# Patient Record
Sex: Male | Born: 1960 | Race: White | Hispanic: No | Marital: Married | Smoking: Never smoker
Health system: Southern US, Community
[De-identification: ages and names within clinical notes are randomized; demographics above are authoritative.]

## PROBLEM LIST (undated history)

## (undated) DIAGNOSIS — E119 Type 2 diabetes mellitus without complications: Secondary | ICD-10-CM

## (undated) DIAGNOSIS — E785 Hyperlipidemia, unspecified: Secondary | ICD-10-CM

## (undated) HISTORY — DX: Type 2 diabetes mellitus without complications: E11.9

## (undated) HISTORY — DX: Hyperlipidemia, unspecified: E78.5

## (undated) HISTORY — PX: ROTATOR CUFF REPAIR: SHX139

## (undated) HISTORY — PX: KNEE SURGERY: SHX244

---

## 1999-11-07 ENCOUNTER — Inpatient Hospital Stay (HOSPITAL_COMMUNITY): Admission: EM | Admit: 1999-11-07 | Discharge: 1999-11-08 | Payer: Self-pay | Admitting: Cardiology

## 2004-09-17 ENCOUNTER — Ambulatory Visit: Payer: Self-pay | Admitting: Cardiology

## 2004-10-15 ENCOUNTER — Ambulatory Visit: Payer: Self-pay | Admitting: Cardiology

## 2015-08-15 ENCOUNTER — Other Ambulatory Visit: Payer: Self-pay | Admitting: "Endocrinology

## 2015-08-22 ENCOUNTER — Other Ambulatory Visit: Payer: Self-pay | Admitting: "Endocrinology

## 2015-09-07 LAB — HEMOGLOBIN A1C: Hgb A1c MFr Bld: 9.1 % — AB (ref 4.0–6.0)

## 2015-09-14 ENCOUNTER — Other Ambulatory Visit: Payer: Self-pay | Admitting: "Endocrinology

## 2015-09-14 ENCOUNTER — Encounter: Payer: Self-pay | Admitting: "Endocrinology

## 2015-09-14 ENCOUNTER — Ambulatory Visit (INDEPENDENT_AMBULATORY_CARE_PROVIDER_SITE_OTHER): Payer: BLUE CROSS/BLUE SHIELD | Admitting: "Endocrinology

## 2015-09-14 VITALS — BP 136/83 | HR 100 | Ht 72.0 in | Wt 228.0 lb

## 2015-09-14 DIAGNOSIS — E118 Type 2 diabetes mellitus with unspecified complications: Secondary | ICD-10-CM

## 2015-09-14 DIAGNOSIS — E782 Mixed hyperlipidemia: Secondary | ICD-10-CM | POA: Insufficient documentation

## 2015-09-14 DIAGNOSIS — IMO0002 Reserved for concepts with insufficient information to code with codable children: Secondary | ICD-10-CM

## 2015-09-14 DIAGNOSIS — I10 Essential (primary) hypertension: Secondary | ICD-10-CM | POA: Diagnosis not present

## 2015-09-14 DIAGNOSIS — E1165 Type 2 diabetes mellitus with hyperglycemia: Secondary | ICD-10-CM | POA: Diagnosis not present

## 2015-09-14 DIAGNOSIS — E1159 Type 2 diabetes mellitus with other circulatory complications: Secondary | ICD-10-CM | POA: Insufficient documentation

## 2015-09-14 DIAGNOSIS — Z794 Long term (current) use of insulin: Secondary | ICD-10-CM

## 2015-09-14 DIAGNOSIS — E785 Hyperlipidemia, unspecified: Secondary | ICD-10-CM | POA: Diagnosis not present

## 2015-09-14 MED ORDER — INSULIN DEGLUDEC 100 UNIT/ML ~~LOC~~ SOPN: 50.0000 [IU] | PEN_INJECTOR | Freq: Every day | SUBCUTANEOUS | Status: DC

## 2015-09-14 NOTE — Progress Notes (Signed)
Subjective:    Patient ID: George White, male    DOB: 05-18-1961,    Past Medical History  Diagnosis Date  . Diabetes mellitus, type II (HCC)   . Hyperlipidemia    Past Surgical History  Procedure Laterality Date  . Rotator cuff repair     Social History   Social History  . Marital Status: Married    Spouse Name: N/A  . Number of Children: N/A  . Years of Education: N/A   Social History Main Topics  . Smoking status: Never Smoker   . Smokeless tobacco: None  . Alcohol Use: No  . Drug Use: No  . Sexual Activity: Not Asked   Other Topics Concern  . None   Social History Narrative  . None   Outpatient Encounter Prescriptions as of 09/14/2015  Medication Sig  . INVOKANA 300 MG TABS tablet TAKE 1 TABLET BY MOUTH EVERY MORNING  . metFORMIN (GLUCOPHAGE) 1000 MG tablet Take 1,000 mg by mouth 2 (two) times daily with a meal.  . sildenafil (VIAGRA) 50 MG tablet Take 50 mg by mouth daily as needed for erectile dysfunction.  . simvastatin (ZOCOR) 10 MG tablet TAKE 1 TABLET BY MOUTH AT BEDTIME  . [DISCONTINUED] Albiglutide (TANZEUM) 50 MG PEN Inject into the skin once a week.  . Insulin Degludec (TRESIBA FLEXTOUCH) 100 UNIT/ML SOPN Inject 50 Units into the skin at bedtime.  . [DISCONTINUED] LEVEMIR FLEXTOUCH 100 UNIT/ML Pen INJECT 60 UNITS SUB-Q AT BEDTIME   No facility-administered encounter medications on file as of 09/14/2015.   ALLERGIES: No Known Allergies VACCINATION STATUS:  There is no immunization history on file for this patient.  Diabetes He presents for his follow-up diabetic visit. He has type 2 diabetes mellitus. Onset time: He was diagnosed at approximate age of 29 years. His disease course has been worsening. There are no hypoglycemic associated symptoms. Pertinent negatives for hypoglycemia include no confusion, headaches, pallor or seizures. Associated symptoms include polydipsia and polyuria. Pertinent negatives for diabetes include no chest pain,  no fatigue, no polyphagia and no weakness. There are no hypoglycemic complications. Symptoms are worsening. Diabetic complications include impotence. Risk factors for coronary artery disease include diabetes mellitus, dyslipidemia, hypertension, male sex and sedentary lifestyle. Current diabetic treatment includes insulin injections and oral agent (dual therapy). He is compliant with treatment most of the time. His weight is stable. He is following a generally unhealthy diet. He participates in exercise intermittently. His home blood glucose trend is increasing steadily. His overall blood glucose range is >200 mg/dl. An ACE inhibitor/angiotensin II receptor blocker is not being taken.  Hyperlipidemia This is a chronic problem. The current episode started more than 1 year ago. The problem is uncontrolled. Exacerbating diseases include diabetes and obesity. Pertinent negatives include no chest pain, myalgias or shortness of breath. Current antihyperlipidemic treatment includes statins.  Hypertension Pertinent negatives include no chest pain, headaches, neck pain, palpitations or shortness of breath.     Review of Systems  Constitutional: Negative for fatigue and unexpected weight change.  HENT: Negative for dental problem, mouth sores and trouble swallowing.   Eyes: Negative for visual disturbance.  Respiratory: Negative for cough, choking, chest tightness, shortness of breath and wheezing.   Cardiovascular: Negative for chest pain, palpitations and leg swelling.  Gastrointestinal: Negative for nausea, vomiting, abdominal pain, diarrhea, constipation and abdominal distention.  Endocrine: Positive for polydipsia and polyuria. Negative for polyphagia.  Genitourinary: Positive for impotence. Negative for dysuria, urgency, hematuria and flank pain.  Musculoskeletal: Negative for myalgias, back pain, gait problem and neck pain.  Skin: Negative for pallor, rash and wound.  Neurological: Negative for  seizures, syncope, weakness, numbness and headaches.  Psychiatric/Behavioral: Negative.  Negative for confusion and dysphoric mood.    Objective:    BP 136/83 mmHg  Pulse 100  Ht 6' (1.829 m)  Wt 228 lb (103.42 kg)  BMI 30.92 kg/m2  SpO2 96%  Wt Readings from Last 3 Encounters:  09/14/15 228 lb (103.42 kg)    Physical Exam  Constitutional: He is oriented to person, place, and time. He appears well-developed and well-nourished. He is cooperative. No distress.  HENT:  Head: Normocephalic and atraumatic.  Eyes: EOM are normal.  Neck: Normal range of motion. Neck supple. No tracheal deviation present. No thyromegaly present.  Cardiovascular: Normal rate, S1 normal, S2 normal and normal heart sounds.  Exam reveals no gallop.   No murmur heard. Pulses:      Dorsalis pedis pulses are 1+ on the right side, and 1+ on the left side.       Posterior tibial pulses are 1+ on the right side, and 1+ on the left side.  Pulmonary/Chest: Breath sounds normal. No respiratory distress. He has no wheezes.  Abdominal: Soft. Bowel sounds are normal. He exhibits no distension. There is no tenderness. There is no guarding and no CVA tenderness.  Musculoskeletal: He exhibits no edema.       Right shoulder: He exhibits no swelling and no deformity.  Neurological: He is alert and oriented to person, place, and time. He has normal strength and normal reflexes. No cranial nerve deficit or sensory deficit. Gait normal.  Skin: Skin is warm and dry. No rash noted. No cyanosis. Nails show no clubbing.  Psychiatric: He has a normal mood and affect. His speech is normal and behavior is normal. Judgment and thought content normal. Cognition and memory are normal.   He is A1c remains high at 9%.   Assessment & Plan:   1. Uncontrolled type 2 diabetes mellitus with complication, with long-term current use of insulin (HCC)  - patient remains at a high risk for more acute and chronic complications of diabetes which  include CAD, CVA, CKD, retinopathy, and neuropathy. These are all discussed in detail with the patient.  Patient came with above target glucose profile, and  recent A1c of 9.1% unchanged from last visit when it was 9 %.  Glucose logs and insulin administration records pertaining to this visit,  to be scanned into patient's records.  Recent labs reviewed.   - I have re-counseled the patient on diet management and weight loss  by adopting a carbohydrate restricted / protein rich  Diet.  - Suggestion is made for patient to avoid simple carbohydrates   from their diet including Cakes , Desserts, Ice Cream,  Soda (  diet and regular) , Sweet Tea , Candies,  Chips, Cookies, Artificial Sweeteners,   and "Sugar-free" Products .  This will help patient to have stable blood glucose profile and potentially avoid unintended  Weight gain.  - Patient is advised to stick to a routine mealtimes to eat 3 meals  a day and avoid unnecessary snacks ( to snack only to correct hypoglycemia).  - The patient  will be  scheduled with Norm Salt, RDN, CDE for individualized DM education.  - I have approached patient with the following individualized plan to manage diabetes and patient agrees.  - His insurance would not pay for Levemir, I will  switch to Tresiba 100  50 units  qhs.  -He will monitor BG AC and HS and present his log and meter in 2 weeks  for review. -He may need prandial insulin based on his blood glucose readings. -Patient is encouraged to call clinic for blood glucose levels less than 70 or above 300 mg /dl. -I will continue MTF  po BID, Invokana to  po qday.  -His insurance stopped paying for Tanzeum, hence I  will discontinue.   - Patient specific target  for A1c; LDL, HDL, Triglycerides, and  Waist Circumference were discussed in detail.  2) BP/HTN: Uncontrolled.  I will consider low-dose ace inhibitors next visit.  3) Lipids/HPL:  continue statins. 4)  Weight/Diet: CDE consult in  progress, exercise, and carbohydrates information provided.  5) Chronic Care/Health Maintenance:  -Patient is  on  medications and encouraged to continue to follow up with Ophthalmology, Podiatrist at least yearly or according to recommendations, and advised to  stay away from smoking. I have recommended yearly flu vaccine and pneumonia vaccination at least every 5 years; moderate intensity exercise for up to 150 minutes weekly; and  sleep for at least 7 hours a day.  - 25 minutes of time was spent on the care of this patient , 50% of which was applied for counseling on diabetes complications and their preventions.   - I advised patient to maintain close follow up with their PCP for primary care needs.  - Patient is asked to bring meter and  blood glucose logs during their next visit.   Follow up plan: Return in about 2 weeks (around 09/28/2015) for diabetes, high blood pressure, high cholesterol, follow up with meter and logs- no labs.  George Lunch, MD Phone: [phone]  Fax: [phone]   09/14/2015, 6:49 PM

## 2015-09-14 NOTE — Patient Instructions (Signed)

## 2015-09-19 ENCOUNTER — Other Ambulatory Visit: Payer: Self-pay | Admitting: "Endocrinology

## 2015-09-29 ENCOUNTER — Ambulatory Visit (INDEPENDENT_AMBULATORY_CARE_PROVIDER_SITE_OTHER): Payer: BLUE CROSS/BLUE SHIELD | Admitting: "Endocrinology

## 2015-09-29 ENCOUNTER — Encounter: Payer: Self-pay | Admitting: "Endocrinology

## 2015-09-29 VITALS — BP 117/74 | HR 93 | Ht 72.0 in | Wt 227.0 lb

## 2015-09-29 DIAGNOSIS — I10 Essential (primary) hypertension: Secondary | ICD-10-CM | POA: Diagnosis not present

## 2015-09-29 DIAGNOSIS — Z794 Long term (current) use of insulin: Secondary | ICD-10-CM

## 2015-09-29 DIAGNOSIS — E118 Type 2 diabetes mellitus with unspecified complications: Secondary | ICD-10-CM

## 2015-09-29 DIAGNOSIS — IMO0002 Reserved for concepts with insufficient information to code with codable children: Secondary | ICD-10-CM

## 2015-09-29 DIAGNOSIS — E785 Hyperlipidemia, unspecified: Secondary | ICD-10-CM

## 2015-09-29 DIAGNOSIS — E1165 Type 2 diabetes mellitus with hyperglycemia: Secondary | ICD-10-CM | POA: Diagnosis not present

## 2015-09-29 MED ORDER — INSULIN DEGLUDEC 100 UNIT/ML ~~LOC~~ SOPN: 56.0000 [IU] | PEN_INJECTOR | Freq: Every day | SUBCUTANEOUS | Status: DC

## 2015-09-29 NOTE — Patient Instructions (Signed)

## 2015-09-30 NOTE — Progress Notes (Signed)
Subjective:    Patient ID: George White, male    DOB: 1961/01/30,    Past Medical History  Diagnosis Date  . Diabetes mellitus, type II (HCC)   . Hyperlipidemia    Past Surgical History  Procedure Laterality Date  . Rotator cuff repair     Social History   Social History  . Marital Status: Married    Spouse Name: N/A  . Number of Children: N/A  . Years of Education: N/A   Social History Main Topics  . Smoking status: Never Smoker   . Smokeless tobacco: None  . Alcohol Use: No  . Drug Use: No  . Sexual Activity: Not Asked   Other Topics Concern  . None   Social History Narrative   Outpatient Encounter Prescriptions as of 09/29/2015  Medication Sig  . BD PEN NEEDLE NANO U/F 32G X 4 MM MISC USE EVERY DAY AT BEDTIME  . Insulin Degludec (TRESIBA FLEXTOUCH) 100 UNIT/ML SOPN Inject 56 Units into the skin at bedtime.  . INVOKANA 300 MG TABS tablet TAKE 1 TABLET BY MOUTH EVERY MORNING  . metFORMIN (GLUCOPHAGE) 1000 MG tablet TAKE 1 TABLET BY MOUTH TWICE A DAY  . sildenafil (VIAGRA) 50 MG tablet Take 50 mg by mouth daily as needed for erectile dysfunction.  . simvastatin (ZOCOR) 10 MG tablet TAKE 1 TABLET BY MOUTH AT BEDTIME  . [DISCONTINUED] Insulin Degludec (TRESIBA FLEXTOUCH) 100 UNIT/ML SOPN Inject 50 Units into the skin at bedtime.   No facility-administered encounter medications on file as of 09/29/2015.   ALLERGIES: No Known Allergies VACCINATION STATUS:  There is no immunization history on file for this patient.  Diabetes He presents for his follow-up diabetic visit. He has type 2 diabetes mellitus. Onset time: He was diagnosed at approximate age of 29 years. His disease course has been worsening. There are no hypoglycemic associated symptoms. Pertinent negatives for hypoglycemia include no confusion, headaches, pallor or seizures. Associated symptoms include polydipsia and polyuria. Pertinent negatives for diabetes include no chest pain, no fatigue, no  polyphagia and no weakness. There are no hypoglycemic complications. Symptoms are improving. Diabetic complications include impotence. Risk factors for coronary artery disease include diabetes mellitus, dyslipidemia, hypertension, male sex and sedentary lifestyle. Current diabetic treatment includes insulin injections and oral agent (dual therapy). He is compliant with treatment most of the time. His weight is stable. He is following a generally healthy diet. He participates in exercise intermittently. His home blood glucose trend is increasing steadily. His breakfast blood glucose range is generally 140-180 mg/dl. His lunch blood glucose range is generally 140-180 mg/dl. His dinner blood glucose range is generally 140-180 mg/dl. His overall blood glucose range is 140-180 mg/dl. An ACE inhibitor/angiotensin II receptor blocker is not being taken.  Hyperlipidemia This is a chronic problem. The current episode started more than 1 year ago. The problem is uncontrolled. Exacerbating diseases include diabetes and obesity. Pertinent negatives include no chest pain, myalgias or shortness of breath. Current antihyperlipidemic treatment includes statins.  Hypertension Pertinent negatives include no chest pain, headaches, neck pain, palpitations or shortness of breath.     Review of Systems  Constitutional: Negative for fatigue and unexpected weight change.  HENT: Negative for dental problem, mouth sores and trouble swallowing.   Eyes: Negative for visual disturbance.  Respiratory: Negative for cough, choking, chest tightness, shortness of breath and wheezing.   Cardiovascular: Negative for chest pain, palpitations and leg swelling.  Gastrointestinal: Negative for nausea, vomiting, abdominal pain, diarrhea, constipation  and abdominal distention.  Endocrine: Positive for polydipsia and polyuria. Negative for polyphagia.  Genitourinary: Positive for impotence. Negative for dysuria, urgency, hematuria and flank  pain.  Musculoskeletal: Negative for myalgias, back pain, gait problem and neck pain.  Skin: Negative for pallor, rash and wound.  Neurological: Negative for seizures, syncope, weakness, numbness and headaches.  Psychiatric/Behavioral: Negative.  Negative for confusion and dysphoric mood.    Objective:    BP 117/74 mmHg  Pulse 93  Ht 6' (1.829 m)  Wt 227 lb (102.967 kg)  BMI 30.78 kg/m2  SpO2 97%  Wt Readings from Last 3 Encounters:  09/29/15 227 lb (102.967 kg)  09/14/15 228 lb (103.42 kg)    Physical Exam  Constitutional: He is oriented to person, place, and time. He appears well-developed and well-nourished. He is cooperative. No distress.  HENT:  Head: Normocephalic and atraumatic.  Eyes: EOM are normal.  Neck: Normal range of motion. Neck supple. No tracheal deviation present. No thyromegaly present.  Cardiovascular: Normal rate, S1 normal, S2 normal and normal heart sounds.  Exam reveals no gallop.   No murmur heard. Pulses:      Dorsalis pedis pulses are 1+ on the right side, and 1+ on the left side.       Posterior tibial pulses are 1+ on the right side, and 1+ on the left side.  Pulmonary/Chest: Breath sounds normal. No respiratory distress. He has no wheezes.  Abdominal: Soft. Bowel sounds are normal. He exhibits no distension. There is no tenderness. There is no guarding and no CVA tenderness.  Musculoskeletal: He exhibits no edema.       Right shoulder: He exhibits no swelling and no deformity.  Neurological: He is alert and oriented to person, place, and time. He has normal strength and normal reflexes. No cranial nerve deficit or sensory deficit. Gait normal.  Skin: Skin is warm and dry. No rash noted. No cyanosis. Nails show no clubbing.  Psychiatric: He has a normal mood and affect. His speech is normal and behavior is normal. Judgment and thought content normal. Cognition and memory are normal.   He is A1c remains high at 9%.   Assessment & Plan:   1.  Uncontrolled type 2 diabetes mellitus with complication, with long-term current use of insulin (HCC)  - patient remains at a high risk for more acute and chronic complications of diabetes which include CAD, CVA, CKD, retinopathy, and neuropathy. These are all discussed in detail with the patient.  Patient came with near  target glucose profile, but  recent A1c of 9.1% unchanged from last visit when it was 9 %.  Glucose logs and insulin administration records pertaining to this visit,  to be scanned into patient's records.  Recent labs reviewed.   - I have re-counseled the patient on diet management and weight loss  by adopting a carbohydrate restricted / protein rich  Diet.  - Suggestion is made for patient to avoid simple carbohydrates   from their diet including Cakes , Desserts, Ice Cream,  Soda (  diet and regular) , Sweet Tea , Candies,  Chips, Cookies, Artificial Sweeteners,   and "Sugar-free" Products .  This will help patient to have stable blood glucose profile and potentially avoid unintended  Weight gain.  - Patient is advised to stick to a routine mealtimes to eat 3 meals  a day and avoid unnecessary snacks ( to snack only to correct hypoglycemia).  - The patient  will be  scheduled with Norm Salt,  RDN, CDE for individualized DM education.  - I have approached patient with the following individualized plan to manage diabetes and patient agrees.  - I will increase his Evaristo Bury to 56 units qhs,  Associated with monitoing of BG QAM.  -He will not need prandial insulin based on his blood glucose readings. -Patient is encouraged to call clinic for blood glucose levels less than 70 or above 300 mg /dl. -I will continue MTF  po BID, Invokana to  po qday.  -His insurance stopped paying for Tanzeum, hence I  will discontinue.  - Patient specific target  for A1c; LDL, HDL, Triglycerides, and  Waist Circumference were discussed in detail.  2) BP/HTN: Uncontrolled.  I will  consider low-dose ace inhibitors next visit.  3) Lipids/HPL:  continue statins. 4)  Weight/Diet: CDE consult in progress, exercise, and carbohydrates information provided.  5) Chronic Care/Health Maintenance:  -Patient is  on  medications and encouraged to continue to follow up with Ophthalmology, Podiatrist at least yearly or according to recommendations, and advised to  stay away from smoking. I have recommended yearly flu vaccine and pneumonia vaccination at least every 5 years; moderate intensity exercise for up to 150 minutes weekly; and  sleep for at least 7 hours a day.  - 25 minutes of time was spent on the care of this patient , 50% of which was applied for counseling on diabetes complications and their preventions.   - I advised patient to maintain close follow up with their PCP for primary care needs.  - Patient is asked to bring meter and  blood glucose logs during their next visit.   Follow up plan: Return in about 11 weeks (around 12/15/2015) for diabetes, high blood pressure, high cholesterol, follow up with pre-visit labs, meter, and logs.  Marquis Lunch, MD Phone: [phone]  Fax: [phone]   09/30/2015, 4:16 AM

## 2015-10-18 ENCOUNTER — Other Ambulatory Visit: Payer: Self-pay

## 2015-10-18 MED ORDER — METFORMIN HCL 1000 MG PO TABS: 1000.0000 mg | ORAL_TABLET | Freq: Two times a day (BID) | ORAL | Status: DC

## 2015-12-08 ENCOUNTER — Other Ambulatory Visit: Payer: Self-pay | Admitting: "Endocrinology

## 2015-12-12 ENCOUNTER — Other Ambulatory Visit: Payer: Self-pay | Admitting: "Endocrinology

## 2015-12-12 LAB — BASIC METABOLIC PANEL
BUN: 11 mg/dL (ref 7–25)
CO2: 26 mmol/L (ref 20–31)
Calcium: 9.4 mg/dL (ref 8.6–10.3)
Chloride: 103 mmol/L (ref 98–110)
Creat: 0.78 mg/dL (ref 0.70–1.33)
Glucose, Bld: 169 mg/dL — ABNORMAL HIGH (ref 65–99)
Potassium: 4.4 mmol/L (ref 3.5–5.3)
Sodium: 138 mmol/L (ref 135–146)

## 2015-12-12 LAB — HEMOGLOBIN A1C
Hgb A1c MFr Bld: 8.7 % — ABNORMAL HIGH (ref <5.7)
Mean Plasma Glucose: 203 mg/dL — ABNORMAL HIGH (ref <117)

## 2015-12-15 ENCOUNTER — Ambulatory Visit (INDEPENDENT_AMBULATORY_CARE_PROVIDER_SITE_OTHER): Payer: BLUE CROSS/BLUE SHIELD | Admitting: "Endocrinology

## 2015-12-15 ENCOUNTER — Encounter: Payer: Self-pay | Admitting: "Endocrinology

## 2015-12-15 VITALS — BP 115/80 | HR 106 | Ht 72.0 in | Wt 230.0 lb

## 2015-12-15 DIAGNOSIS — I10 Essential (primary) hypertension: Secondary | ICD-10-CM | POA: Diagnosis not present

## 2015-12-15 DIAGNOSIS — Z794 Long term (current) use of insulin: Secondary | ICD-10-CM | POA: Diagnosis not present

## 2015-12-15 DIAGNOSIS — IMO0002 Reserved for concepts with insufficient information to code with codable children: Secondary | ICD-10-CM

## 2015-12-15 DIAGNOSIS — E118 Type 2 diabetes mellitus with unspecified complications: Secondary | ICD-10-CM | POA: Diagnosis not present

## 2015-12-15 DIAGNOSIS — E1165 Type 2 diabetes mellitus with hyperglycemia: Secondary | ICD-10-CM | POA: Diagnosis not present

## 2015-12-15 DIAGNOSIS — E785 Hyperlipidemia, unspecified: Secondary | ICD-10-CM | POA: Diagnosis not present

## 2015-12-15 MED ORDER — INSULIN DEGLUDEC 100 UNIT/ML ~~LOC~~ SOPN: 56.0000 [IU] | PEN_INJECTOR | Freq: Every day | SUBCUTANEOUS | Status: DC

## 2015-12-15 MED ORDER — DULAGLUTIDE 0.75 MG/0.5ML ~~LOC~~ SOAJ: 0.7500 mg | SUBCUTANEOUS | Status: DC

## 2015-12-15 NOTE — Progress Notes (Signed)
Subjective:    Patient ID: George White, male    DOB: 1961-08-24,    Past Medical History  Diagnosis Date  . Diabetes mellitus, type II (HCC)   . Hyperlipidemia    Past Surgical History  Procedure Laterality Date  . Rotator cuff repair     Social History   Social History  . Marital Status: Married    Spouse Name: N/A  . Number of Children: N/A  . Years of Education: N/A   Social History Main Topics  . Smoking status: Never Smoker   . Smokeless tobacco: None  . Alcohol Use: No  . Drug Use: No  . Sexual Activity: Not Asked   Other Topics Concern  . None   Social History Narrative   Outpatient Encounter Prescriptions as of 12/15/2015  Medication Sig  . BD PEN NEEDLE NANO U/F 32G X 4 MM MISC USE EVERY DAY AT BEDTIME  . Dulaglutide (TRULICITY) 0.75 MG/0.5ML SOPN Inject 0.75 mg into the skin once a week.  . Insulin Degludec (TRESIBA FLEXTOUCH) 100 UNIT/ML SOPN Inject 56 Units into the skin at bedtime.  . INVOKANA 300 MG TABS tablet TAKE 1 TABLET BY MOUTH EVERY MORNING  . metFORMIN (GLUCOPHAGE) 1000 MG tablet Take 1 tablet (1,000 mg total) by mouth 2 (two) times daily.  . sildenafil (VIAGRA) 50 MG tablet Take 50 mg by mouth daily as needed for erectile dysfunction.  . simvastatin (ZOCOR) 10 MG tablet TAKE 1 TABLET BY MOUTH AT BEDTIME  . [DISCONTINUED] Insulin Degludec (TRESIBA FLEXTOUCH) 100 UNIT/ML SOPN Inject 56 Units into the skin at bedtime. (Patient taking differently: Inject 50 Units into the skin at bedtime. )   No facility-administered encounter medications on file as of 12/15/2015.   ALLERGIES: No Known Allergies VACCINATION STATUS:  There is no immunization history on file for this patient.  Diabetes He presents for his follow-up diabetic visit. He has type 2 diabetes mellitus. Onset time: He was diagnosed at approximate age of 29 years. His disease course has been improving. There are no hypoglycemic associated symptoms. Pertinent negatives for  hypoglycemia include no confusion, headaches, pallor or seizures. Pertinent negatives for diabetes include no chest pain, no fatigue, no polydipsia, no polyphagia, no polyuria and no weakness. There are no hypoglycemic complications. Symptoms are improving. Diabetic complications include impotence. Risk factors for coronary artery disease include diabetes mellitus, dyslipidemia, hypertension, male sex and sedentary lifestyle. Current diabetic treatment includes insulin injections and oral agent (dual therapy) (As well as metformin and Invokana.). He is compliant with treatment most of the time. His weight is stable. He is following a generally healthy diet. He participates in exercise intermittently. His home blood glucose trend is increasing steadily. His breakfast blood glucose range is generally 140-180 mg/dl. An ACE inhibitor/angiotensin II receptor blocker is not being taken.  Hyperlipidemia This is a chronic problem. The current episode started more than 1 year ago. The problem is uncontrolled. Exacerbating diseases include diabetes and obesity. Pertinent negatives include no chest pain, myalgias or shortness of breath. Current antihyperlipidemic treatment includes statins.  Hypertension Pertinent negatives include no chest pain, headaches, neck pain, palpitations or shortness of breath.     Review of Systems  Constitutional: Negative for fatigue and unexpected weight change.  HENT: Negative for dental problem, mouth sores and trouble swallowing.   Eyes: Negative for visual disturbance.  Respiratory: Negative for cough, choking, chest tightness, shortness of breath and wheezing.   Cardiovascular: Negative for chest pain, palpitations and leg swelling.  Gastrointestinal: Negative for nausea, vomiting, abdominal pain, diarrhea, constipation and abdominal distention.  Endocrine: Negative for polydipsia, polyphagia and polyuria.  Genitourinary: Positive for impotence. Negative for dysuria, urgency,  hematuria and flank pain.  Musculoskeletal: Negative for myalgias, back pain, gait problem and neck pain.  Skin: Negative for pallor, rash and wound.  Neurological: Negative for seizures, syncope, weakness, numbness and headaches.  Psychiatric/Behavioral: Negative.  Negative for confusion and dysphoric mood.    Objective:    BP 115/80 mmHg  Pulse 106  Ht 6' (1.829 m)  Wt 230 lb (104.327 kg)  BMI 31.19 kg/m2  SpO2 95%  Wt Readings from Last 3 Encounters:  12/15/15 230 lb (104.327 kg)  09/29/15 227 lb (102.967 kg)  09/14/15 228 lb (103.42 kg)    Physical Exam  Constitutional: He is oriented to person, place, and time. He appears well-developed and well-nourished. He is cooperative. No distress.  HENT:  Head: Normocephalic and atraumatic.  Eyes: EOM are normal.  Neck: Normal range of motion. Neck supple. No tracheal deviation present. No thyromegaly present.  Cardiovascular: Normal rate, S1 normal, S2 normal and normal heart sounds.  Exam reveals no gallop.   No murmur heard. Pulses:      Dorsalis pedis pulses are 1+ on the right side, and 1+ on the left side.       Posterior tibial pulses are 1+ on the right side, and 1+ on the left side.  Pulmonary/Chest: Breath sounds normal. No respiratory distress. He has no wheezes.  Abdominal: Soft. Bowel sounds are normal. He exhibits no distension. There is no tenderness. There is no guarding and no CVA tenderness.  Musculoskeletal: He exhibits no edema.       Right shoulder: He exhibits no swelling and no deformity.  Neurological: He is alert and oriented to person, place, and time. He has normal strength and normal reflexes. No cranial nerve deficit or sensory deficit. Gait normal.  Skin: Skin is warm and dry. No rash noted. No cyanosis. Nails show no clubbing.  Psychiatric: He has a normal mood and affect. His speech is normal and behavior is normal. Judgment and thought content normal. Cognition and memory are normal.   His A1c has  improved from 9.1% to  8.7% .   Assessment & Plan:   1. Uncontrolled type 2 diabetes mellitus with complication, with long-term current use of insulin (HCC)  - patient remains at a high risk for more acute and chronic complications of diabetes which include CAD, CVA, CKD, retinopathy, and neuropathy. These are all discussed in detail with the patient.  Patient came with near  target glucose profile, but  recent A1c 8.7% improving from 9.1% .   Glucose logs and insulin administration records pertaining to this visit,  to be scanned into patient's records.  Recent labs reviewed.   - I have re-counseled the patient on diet management and weight loss  by adopting a carbohydrate restricted / protein rich  Diet.  - Suggestion is made for patient to avoid simple carbohydrates   from their diet including Cakes , Desserts, Ice Cream,  Soda (  diet and regular) , Sweet Tea , Candies,  Chips, Cookies, Artificial Sweeteners,   and "Sugar-free" Products .  This will help patient to have stable blood glucose profile and potentially avoid unintended  Weight gain.  - Patient is advised to stick to a routine mealtimes to eat 3 meals  a day and avoid unnecessary snacks ( to snack only to correct hypoglycemia).  -  The patient  will be  scheduled with Norm Salt, RDN, CDE for individualized DM education.  - I have approached patient with the following individualized plan to manage diabetes and patient agrees.  - I will increase his Evaristo Bury to 56 units qhs,  Associated with monitoing of BG QAM.  -He will not need prandial insulin based on his blood glucose readings. -Patient is encouraged to call clinic for blood glucose levels less than 70 or above 300 mg /dl. -I will continue MTF 1000mg  po BID, Invokana to 300mg  po qday.  -His insurance stopped paying for Tanzeum, hence I  will   prescribed Trulicity 0.75 mg subcutaneous weekly . -4 weeks' samples given to patient.   - Patient specific target  for  A1c; LDL, HDL, Triglycerides, and  Waist Circumference were discussed in detail.  2) BP/HTN: Uncontrolled.  I will consider low-dose ace inhibitors next visit.  3) Lipids/HPL:  continue statins. 4)  Weight/Diet: CDE consult in progress, exercise, and carbohydrates information provided.  5) Chronic Care/Health Maintenance:  -Patient is  on  medications and encouraged to continue to follow up with Ophthalmology, Podiatrist at least yearly or according to recommendations, and advised to  stay away from smoking. I have recommended yearly flu vaccine and pneumonia vaccination at least every 5 years; moderate intensity exercise for up to 150 minutes weekly; and  sleep for at least 7 hours a day.  - 25 minutes of time was spent on the care of this patient , 50% of which was applied for counseling on diabetes complications and their preventions.   - I advised patient to maintain close follow up with their PCP for primary care needs.  - Patient is asked to bring meter and  blood glucose logs during their next visit.   Follow up plan: Return in about 3 months (around 03/13/2016) for diabetes, high blood pressure, high cholesterol, follow up with pre-visit labs, meter, and logs.  Marquis Lunch, MD Phone: [phone]  Fax: [phone]   12/15/2015, 4:05 PM

## 2015-12-15 NOTE — Patient Instructions (Signed)

## 2016-01-12 ENCOUNTER — Other Ambulatory Visit: Payer: Self-pay | Admitting: "Endocrinology

## 2016-03-01 ENCOUNTER — Other Ambulatory Visit: Payer: Self-pay | Admitting: "Endocrinology

## 2016-03-15 ENCOUNTER — Other Ambulatory Visit: Payer: Self-pay | Admitting: "Endocrinology

## 2016-03-15 ENCOUNTER — Ambulatory Visit: Payer: BLUE CROSS/BLUE SHIELD | Admitting: "Endocrinology

## 2016-03-15 LAB — BASIC METABOLIC PANEL
BUN: 14 mg/dL (ref 7–25)
CO2: 28 mmol/L (ref 20–31)
Calcium: 9.1 mg/dL (ref 8.6–10.3)
Chloride: 103 mmol/L (ref 98–110)
Creat: 0.73 mg/dL (ref 0.70–1.33)
Glucose, Bld: 149 mg/dL — ABNORMAL HIGH (ref 65–99)
Potassium: 4.3 mmol/L (ref 3.5–5.3)
Sodium: 140 mmol/L (ref 135–146)

## 2016-03-16 LAB — HEMOGLOBIN A1C
Hgb A1c MFr Bld: 8.1 % — ABNORMAL HIGH (ref <5.7)
Mean Plasma Glucose: 186 mg/dL

## 2016-03-19 ENCOUNTER — Other Ambulatory Visit: Payer: Self-pay | Admitting: "Endocrinology

## 2016-03-22 ENCOUNTER — Ambulatory Visit (INDEPENDENT_AMBULATORY_CARE_PROVIDER_SITE_OTHER): Payer: BLUE CROSS/BLUE SHIELD | Admitting: "Endocrinology

## 2016-03-22 ENCOUNTER — Encounter: Payer: Self-pay | Admitting: "Endocrinology

## 2016-03-22 VITALS — BP 120/82 | HR 95 | Resp 18 | Ht 72.0 in | Wt 230.0 lb

## 2016-03-22 DIAGNOSIS — E118 Type 2 diabetes mellitus with unspecified complications: Secondary | ICD-10-CM | POA: Diagnosis not present

## 2016-03-22 DIAGNOSIS — I10 Essential (primary) hypertension: Secondary | ICD-10-CM | POA: Diagnosis not present

## 2016-03-22 DIAGNOSIS — E1165 Type 2 diabetes mellitus with hyperglycemia: Secondary | ICD-10-CM | POA: Diagnosis not present

## 2016-03-22 DIAGNOSIS — E785 Hyperlipidemia, unspecified: Secondary | ICD-10-CM

## 2016-03-22 DIAGNOSIS — IMO0002 Reserved for concepts with insufficient information to code with codable children: Secondary | ICD-10-CM

## 2016-03-22 DIAGNOSIS — Z794 Long term (current) use of insulin: Secondary | ICD-10-CM | POA: Diagnosis not present

## 2016-03-22 NOTE — Patient Instructions (Signed)

## 2016-03-22 NOTE — Progress Notes (Signed)
Subjective:    Patient ID: George White, male    DOB: April 17, 1961,    Past Medical History  Diagnosis Date  . Diabetes mellitus, type II (HCC)   . Hyperlipidemia    Past Surgical History  Procedure Laterality Date  . Rotator cuff repair     Social History   Social History  . Marital Status: Married    Spouse Name: N/A  . Number of Children: N/A  . Years of Education: N/A   Social History Main Topics  . Smoking status: Never Smoker   . Smokeless tobacco: None  . Alcohol Use: No  . Drug Use: No  . Sexual Activity: Not Asked   Other Topics Concern  . None   Social History Narrative   Outpatient Encounter Prescriptions as of 03/22/2016  Medication Sig  . BD PEN NEEDLE NANO U/F 32G X 4 MM MISC USE EVERY DAY AT BEDTIME  . Dulaglutide (TRULICITY) 0.75 MG/0.5ML SOPN Inject 0.75 mg into the skin once a week.  . Insulin Degludec (TRESIBA FLEXTOUCH [place]) Inject 60 Units into the skin.  . INVOKANA 300 MG TABS tablet TAKE 1 TABLET BY MOUTH EVERY MORNING  . metFORMIN (GLUCOPHAGE) 1000 MG tablet TAKE 1 TABLET BY MOUTH TWICE A DAY  . simvastatin (ZOCOR) 10 MG tablet TAKE 1 TABLET BY MOUTH AT BEDTIME  . VIAGRA 50 MG tablet TAKE 1 TABLET AS DIRECTED  . [DISCONTINUED] Insulin Degludec (TRESIBA FLEXTOUCH) 100 UNIT/ML SOPN Inject 56 Units into the skin at bedtime.   No facility-administered encounter medications on file as of 03/22/2016.   ALLERGIES: No Known Allergies VACCINATION STATUS:  There is no immunization history on file for this patient.  Diabetes He presents for his follow-up diabetic visit. He has type 2 diabetes mellitus. Onset time: He was diagnosed at approximate age of 29 years. His disease course has been improving. There are no hypoglycemic associated symptoms. Pertinent negatives for hypoglycemia include no confusion, headaches, pallor or seizures. Pertinent negatives for diabetes include no chest pain, no fatigue, no polydipsia, no polyphagia, no polyuria and  no weakness. There are no hypoglycemic complications. Symptoms are improving. Diabetic complications include impotence. Risk factors for coronary artery disease include diabetes mellitus, dyslipidemia, hypertension, male sex and sedentary lifestyle. Current diabetic treatment includes insulin injections and oral agent (dual therapy) (As well as metformin and Invokana.). He is compliant with treatment most of the time. His weight is stable. He is following a generally healthy diet. He participates in exercise intermittently. His home blood glucose trend is increasing steadily. His breakfast blood glucose range is generally 140-180 mg/dl. An ACE inhibitor/angiotensin II receptor blocker is not being taken.  Hyperlipidemia This is a chronic problem. The current episode started more than 1 year ago. The problem is uncontrolled. Exacerbating diseases include diabetes and obesity. Pertinent negatives include no chest pain, myalgias or shortness of breath. Current antihyperlipidemic treatment includes statins.  Hypertension Pertinent negatives include no chest pain, headaches, neck pain, palpitations or shortness of breath.     Review of Systems  Constitutional: Negative for fatigue and unexpected weight change.  HENT: Negative for dental problem, mouth sores and trouble swallowing.   Eyes: Negative for visual disturbance.  Respiratory: Negative for cough, choking, chest tightness, shortness of breath and wheezing.   Cardiovascular: Negative for chest pain, palpitations and leg swelling.  Gastrointestinal: Negative for nausea, vomiting, abdominal pain, diarrhea, constipation and abdominal distention.  Endocrine: Negative for polydipsia, polyphagia and polyuria.  Genitourinary: Positive for impotence. Negative  for dysuria, urgency, hematuria and flank pain.  Musculoskeletal: Negative for myalgias, back pain, gait problem and neck pain.  Skin: Negative for pallor, rash and wound.  Neurological: Negative  for seizures, syncope, weakness, numbness and headaches.  Psychiatric/Behavioral: Negative.  Negative for confusion and dysphoric mood.    Objective:    BP 120/82 mmHg  Pulse 95  Resp 18  Ht 6' (1.829 m)  Wt 230 lb (104.327 kg)  BMI 31.19 kg/m2  SpO2 98%  Wt Readings from Last 3 Encounters:  03/22/16 230 lb (104.327 kg)  12/15/15 230 lb (104.327 kg)  09/29/15 227 lb (102.967 kg)    Physical Exam  Constitutional: He is oriented to person, place, and time. He appears well-developed and well-nourished. He is cooperative. No distress.  HENT:  Head: Normocephalic and atraumatic.  Eyes: EOM are normal.  Neck: Normal range of motion. Neck supple. No tracheal deviation present. No thyromegaly present.  Cardiovascular: Normal rate, S1 normal, S2 normal and normal heart sounds.  Exam reveals no gallop.   No murmur heard. Pulses:      Dorsalis pedis pulses are 1+ on the right side, and 1+ on the left side.       Posterior tibial pulses are 1+ on the right side, and 1+ on the left side.  Pulmonary/Chest: Breath sounds normal. No respiratory distress. He has no wheezes.  Abdominal: Soft. Bowel sounds are normal. He exhibits no distension. There is no tenderness. There is no guarding and no CVA tenderness.  Musculoskeletal: He exhibits no edema.       Right shoulder: He exhibits no swelling and no deformity.  Neurological: He is alert and oriented to person, place, and time. He has normal strength and normal reflexes. No cranial nerve deficit or sensory deficit. Gait normal.  Skin: Skin is warm and dry. No rash noted. No cyanosis. Nails show no clubbing.  Psychiatric: He has a normal mood and affect. His speech is normal and behavior is normal. Judgment and thought content normal. Cognition and memory are normal.   Recent Results (from the past 2160 hour(s))  Basic metabolic panel     Status: Abnormal   Collection Time: 03/15/16  8:08 AM  Result Value Ref Range   Sodium 140 135 - 146  mmol/L   Potassium 4.3 3.5 - 5.3 mmol/L   Chloride 103 98 - 110 mmol/L   CO2 28 20 - 31 mmol/L   Glucose, Bld 149 (H) 65 - 99 mg/dL   BUN 14 7 - 25 mg/dL   Creat 9.60 4.54 - 0.98 mg/dL   Calcium 9.1 8.6 - 11.9 mg/dL  Hemoglobin J4N     Status: Abnormal   Collection Time: 03/15/16  8:08 AM  Result Value Ref Range   Hgb A1c MFr Bld 8.1 (H) <5.7 %    Comment:   For someone without known diabetes, a hemoglobin A1c value of 6.5% or greater indicates that they may have diabetes and this should be confirmed with a follow-up test.   For someone with known diabetes, a value <7% indicates that their diabetes is well controlled and a value greater than or equal to 7% indicates suboptimal control. A1c targets should be individualized based on duration of diabetes, age, comorbid conditions, and other considerations.   Currently, no consensus exists for use of hemoglobin A1c for diagnosis of diabetes for children.      Mean Plasma Glucose 186 mg/dL     Assessment & Plan:   1. Uncontrolled type 2  diabetes mellitus with complication, with long-term current use of insulin (HCC)  - patient remains at a high risk for more acute and chronic complications of diabetes which include CAD, CVA, CKD, retinopathy, and neuropathy. These are all discussed in detail with the patient.  Patient came with near  target glucose profile, but  recent A1c 8.1% improving from 9.1% .   Glucose logs and insulin administration records pertaining to this visit,  to be scanned into patient's records.  Recent labs reviewed.   - I have re-counseled the patient on diet management and weight loss  by adopting a carbohydrate restricted / protein rich  Diet.  - Suggestion is made for patient to avoid simple carbohydrates   from their diet including Cakes , Desserts, Ice Cream,  Soda (  diet and regular) , Sweet Tea , Candies,  Chips, Cookies, Artificial Sweeteners,   and "Sugar-free" Products .  This will help patient to  have stable blood glucose profile and potentially avoid unintended  Weight gain.  - Patient is advised to stick to a routine mealtimes to eat 3 meals  a day and avoid unnecessary snacks ( to snack only to correct hypoglycemia).  - The patient  will be  scheduled with Norm Salt, RDN, CDE for individualized DM education.  - I have approached patient with the following individualized plan to manage diabetes and patient agrees.  - I will increase his Evaristo Bury to 60 units qhs,  Associated with monitoing of BG QAM.  -He will not need prandial insulin based on his blood glucose readings. -Patient is encouraged to call clinic for blood glucose levels less than 70 or above 300 mg /dl. -I will continue MTF 1000mg  po BID, Invokana  300mg  po qday.  -His insurance stopped paying for Tanzeum, hence I  will   prescribed Trulicity 0.75 mg subcutaneous weekly . -4 weeks' samples given to patient.   - Patient specific target  for A1c; LDL, HDL, Triglycerides, and  Waist Circumference were discussed in detail.  2) BP/HTN: Uncontrolled.  I will consider low-dose ace inhibitors next visit.  3) Lipids/HPL:  continue statins. 4)  Weight/Diet: CDE consult in progress, exercise, and carbohydrates information provided.  5) Chronic Care/Health Maintenance:  -Patient is  on  medications and encouraged to continue to follow up with Ophthalmology, Podiatrist at least yearly or according to recommendations, and advised to  stay away from smoking. I have recommended yearly flu vaccine and pneumonia vaccination at least every 5 years; moderate intensity exercise for up to 150 minutes weekly; and  sleep for at least 7 hours a day.  - 25 minutes of time was spent on the care of this patient , 50% of which was applied for counseling on diabetes complications and their preventions.   - I advised patient to maintain close follow up with their PCP for primary care needs.  - Patient is asked to bring meter and  blood  glucose logs during their next visit.   Follow up plan: Return in about 3 months (around 06/22/2016) for diabetes, high blood pressure, high cholesterol, follow up with pre-visit labs, meter, and logs.  Marquis Lunch, MD Phone: [phone]  Fax: [phone]   03/22/2016, 11:40 AM

## 2016-04-26 ENCOUNTER — Other Ambulatory Visit: Payer: Self-pay | Admitting: "Endocrinology

## 2016-06-27 LAB — LIPID PANEL
Cholesterol: 155 mg/dL (ref 0–200)
HDL: 27 mg/dL — AB (ref 35–70)
LDL Cholesterol: 95 mg/dL
Triglycerides: 199 mg/dL — AB (ref 40–160)

## 2016-06-27 LAB — TSH: TSH: 2.65 u[IU]/mL (ref <=5.90)

## 2016-06-27 LAB — HEMOGLOBIN A1C: Hemoglobin A1C: 7.8

## 2016-07-05 ENCOUNTER — Ambulatory Visit (INDEPENDENT_AMBULATORY_CARE_PROVIDER_SITE_OTHER): Payer: BLUE CROSS/BLUE SHIELD | Admitting: "Endocrinology

## 2016-07-05 ENCOUNTER — Encounter: Payer: Self-pay | Admitting: "Endocrinology

## 2016-07-05 VITALS — BP 124/83 | HR 88 | Ht 74.0 in | Wt 230.6 lb

## 2016-07-05 DIAGNOSIS — E785 Hyperlipidemia, unspecified: Secondary | ICD-10-CM | POA: Diagnosis not present

## 2016-07-05 DIAGNOSIS — I10 Essential (primary) hypertension: Secondary | ICD-10-CM

## 2016-07-05 DIAGNOSIS — N529 Male erectile dysfunction, unspecified: Secondary | ICD-10-CM | POA: Insufficient documentation

## 2016-07-05 DIAGNOSIS — E118 Type 2 diabetes mellitus with unspecified complications: Secondary | ICD-10-CM

## 2016-07-05 DIAGNOSIS — E1165 Type 2 diabetes mellitus with hyperglycemia: Secondary | ICD-10-CM | POA: Diagnosis not present

## 2016-07-05 DIAGNOSIS — Z794 Long term (current) use of insulin: Secondary | ICD-10-CM | POA: Diagnosis not present

## 2016-07-05 DIAGNOSIS — IMO0002 Reserved for concepts with insufficient information to code with codable children: Secondary | ICD-10-CM

## 2016-07-05 NOTE — Patient Instructions (Signed)

## 2016-07-05 NOTE — Progress Notes (Signed)
Subjective:    Patient ID: George White, male    DOB: 03/10/1961,    Past Medical History:  Diagnosis Date  . Diabetes mellitus, type II (HCC)   . Hyperlipidemia    Past Surgical History:  Procedure Laterality Date  . ROTATOR CUFF REPAIR     Social History   Social History  . Marital status: Married    Spouse name: N/A  . Number of children: N/A  . Years of education: N/A   Social History Main Topics  . Smoking status: Never Smoker  . Smokeless tobacco: None  . Alcohol use No  . Drug use: No  . Sexual activity: Not Asked   Other Topics Concern  . None   Social History Narrative  . None   Outpatient Encounter Prescriptions as of 07/05/2016  Medication Sig  . BD PEN NEEDLE NANO U/F 32G X 4 MM MISC USE EVERY DAY AT BEDTIME  . Dulaglutide (TRULICITY) 0.75 MG/0.5ML SOPN Inject 0.75 mg into the skin once a week.  . Insulin Degludec (TRESIBA FLEXTOUCH) 100 UNIT/ML SOPN Inject 60 Units into the skin at bedtime.  . INVOKANA 300 MG TABS tablet TAKE 1 TABLET BY MOUTH EVERY MORNING  . metFORMIN (GLUCOPHAGE) 1000 MG tablet TAKE 1 TABLET BY MOUTH TWICE A DAY  . simvastatin (ZOCOR) 10 MG tablet TAKE 1 TABLET BY MOUTH AT BEDTIME  . VIAGRA 50 MG tablet TAKE 1 TABLET AS DIRECTED   No facility-administered encounter medications on file as of 07/05/2016.    ALLERGIES: No Known Allergies VACCINATION STATUS:  There is no immunization history on file for this patient.  Diabetes  He presents for his follow-up diabetic visit. He has type 2 diabetes mellitus. Onset time: He was diagnosed at approximate age of 29 years. His disease course has been improving. There are no hypoglycemic associated symptoms. Pertinent negatives for hypoglycemia include no confusion, headaches, pallor or seizures. Pertinent negatives for diabetes include no chest pain, no fatigue, no polydipsia, no polyphagia, no polyuria and no weakness. There are no hypoglycemic complications. Symptoms are improving.  Diabetic complications include impotence. Risk factors for coronary artery disease include diabetes mellitus, dyslipidemia, hypertension, male sex and sedentary lifestyle. Current diabetic treatment includes insulin injections and oral agent (dual therapy) (As well as metformin and Invokana.). He is compliant with treatment most of the time. His weight is stable. He is following a generally healthy diet. He participates in exercise intermittently. His home blood glucose trend is increasing steadily. His breakfast blood glucose range is generally 140-180 mg/dl. An ACE inhibitor/angiotensin II receptor blocker is not being taken.  Hyperlipidemia  This is a chronic problem. The current episode started more than 1 year ago. The problem is uncontrolled. Exacerbating diseases include diabetes and obesity. Pertinent negatives include no chest pain, myalgias or shortness of breath. Current antihyperlipidemic treatment includes statins.  Hypertension  Pertinent negatives include no chest pain, headaches, neck pain, palpitations or shortness of breath.     Review of Systems  Constitutional: Negative for fatigue and unexpected weight change.  HENT: Negative for dental problem, mouth sores and trouble swallowing.   Eyes: Negative for visual disturbance.  Respiratory: Negative for cough, choking, chest tightness, shortness of breath and wheezing.   Cardiovascular: Negative for chest pain, palpitations and leg swelling.  Gastrointestinal: Negative for abdominal distention, abdominal pain, constipation, diarrhea, nausea and vomiting.  Endocrine: Negative for polydipsia, polyphagia and polyuria.  Genitourinary: Positive for impotence. Negative for dysuria, flank pain, hematuria and urgency.  Musculoskeletal: Negative for back pain, gait problem, myalgias and neck pain.  Skin: Negative for pallor, rash and wound.  Neurological: Negative for seizures, syncope, weakness, numbness and headaches.   Psychiatric/Behavioral: Negative.  Negative for confusion and dysphoric mood.    Objective:    BP 124/83   Pulse 88   Ht 6\' 2"  (1.88 m)   Wt 230 lb 9.6 oz (104.6 kg)   BMI 29.61 kg/m   Wt Readings from Last 3 Encounters:  07/05/16 230 lb 9.6 oz (104.6 kg)  03/22/16 230 lb (104.3 kg)  12/15/15 230 lb (104.3 kg)    Physical Exam  Constitutional: He is oriented to person, place, and time. He appears well-developed and well-nourished. He is cooperative. No distress.  HENT:  Head: Normocephalic and atraumatic.  Eyes: EOM are normal.  Neck: Normal range of motion. Neck supple. No tracheal deviation present. No thyromegaly present.  Cardiovascular: Normal rate, S1 normal, S2 normal and normal heart sounds.  Exam reveals no gallop.   No murmur heard. Pulses:      Dorsalis pedis pulses are 1+ on the right side, and 1+ on the left side.       Posterior tibial pulses are 1+ on the right side, and 1+ on the left side.  Pulmonary/Chest: Breath sounds normal. No respiratory distress. He has no wheezes.  Abdominal: Soft. Bowel sounds are normal. He exhibits no distension. There is no tenderness. There is no guarding and no CVA tenderness.  Musculoskeletal: He exhibits no edema.       Right shoulder: He exhibits no swelling and no deformity.  Neurological: He is alert and oriented to person, place, and time. He has normal strength and normal reflexes. No cranial nerve deficit or sensory deficit. Gait normal.  Skin: Skin is warm and dry. No rash noted. No cyanosis. Nails show no clubbing.  Psychiatric: He has a normal mood and affect. His speech is normal and behavior is normal. Judgment and thought content normal. Cognition and memory are normal.   Recent Results (from the past 2160 hour(s))  Lipid panel     Status: Abnormal   Collection Time: 06/27/16 12:00 AM  Result Value Ref Range   Triglycerides 199 (A) 40 - 160 mg/dL   Cholesterol 829 0 - 562 mg/dL   HDL 27 (A) 35 - 70 mg/dL   LDL  Cholesterol 95 mg/dL  Hemoglobin Z3Y     Status: None   Collection Time: 06/27/16 12:00 AM  Result Value Ref Range   Hemoglobin A1C 7.8   TSH     Status: None   Collection Time: 06/27/16 12:00 AM  Result Value Ref Range   TSH 2.65 0.41 - 5.90 uIU/mL    Assessment & Plan:   1. Uncontrolled type 2 diabetes mellitus with complication, with long-term current use of insulin (HCC)  - patient remains at a high risk for more acute and chronic complications of diabetes which include CAD, CVA, CKD, retinopathy, and neuropathy. These are all discussed in detail with the patient.  Patient came with near  target glucose profile, and  recent A1c Of 7.8% improving from 9.1% .   Glucose logs and insulin administration records pertaining to this visit,  to be scanned into patient's records.  Recent labs reviewed.   - I have re-counseled the patient on diet management and weight loss  by adopting a carbohydrate restricted / protein rich  Diet.  - Suggestion is made for patient to avoid simple carbohydrates   from  their diet including Cakes , Desserts, Ice Cream,  Soda (  diet and regular) , Sweet Tea , Candies,  Chips, Cookies, Artificial Sweeteners,   and "Sugar-free" Products .  This will help patient to have stable blood glucose profile and potentially avoid unintended  Weight gain.  - Patient is advised to stick to a routine mealtimes to eat 3 meals  a day and avoid unnecessary snacks ( to snack only to correct hypoglycemia).  - The patient  will be  scheduled with Norm Salt, RDN, CDE for individualized DM education.  - I have approached patient with the following individualized plan to manage diabetes and patient agrees.  - I will continue  his Tresiba 60 units qhs,  Associated with monitoing of BG QAM.  -He will not need prandial insulin based on his progress. -Patient is encouraged to call clinic for blood glucose levels less than 70 or above 300 mg /dl. -I will continue MTF 1000mg  po  BID, Invokana  300mg  po qday.  -His insurance stopped paying for Tanzeum, hence I  will   Provide samples of Trulicity  0.75 mg subcutaneous weekly .-4 weeks' samples given to patient.   - Patient specific target  for A1c; LDL, HDL, Triglycerides, and  Waist Circumference were discussed in detail.  2) BP/HTN: Uncontrolled.  I will consider low-dose ace inhibitors next visit.  3) Lipids/HPL:  continue statins. 4)  Weight/Diet: CDE consult in progress, exercise, and carbohydrates information provided.  5) Chronic Care/Health Maintenance:  -Patient is  on  medications and encouraged to continue to follow up with Ophthalmology, Podiatrist at least yearly or according to recommendations, and advised to  stay away from smoking. I have recommended yearly flu vaccine and pneumonia vaccination at least every 5 years; moderate intensity exercise for up to 150 minutes weekly; and  sleep for at least 7 hours a day. - His insurance did not pay for Viagra , still complains of erectile dysfunction . He will explore with his pharmacist what alternatives are covered by his insurance.   - 25 minutes of time was spent on the care of this patient , 50% of which was applied for counseling on diabetes complications and their preventions.   - I advised patient to maintain close follow up with their PCP for primary care needs.  - Patient is asked to bring meter and  blood glucose logs during their next visit.   Follow up plan: Return in about 3 months (around 10/04/2016) for follow up with pre-visit labs, meter, and logs.  Marquis Lunch, MD Phone: [phone]  Fax: [phone]   07/05/2016, 12:00 PM

## 2016-07-29 ENCOUNTER — Other Ambulatory Visit: Payer: Self-pay | Admitting: "Endocrinology

## 2016-08-12 ENCOUNTER — Other Ambulatory Visit: Payer: Self-pay

## 2016-08-12 MED ORDER — INSULIN DEGLUDEC 100 UNIT/ML ~~LOC~~ SOPN: 60.0000 [IU] | PEN_INJECTOR | Freq: Every day | SUBCUTANEOUS | 0 refills | Status: DC

## 2016-08-12 MED ORDER — CANAGLIFLOZIN 300 MG PO TABS: 300.0000 mg | ORAL_TABLET | Freq: Every morning | ORAL | 0 refills | Status: DC

## 2016-08-12 MED ORDER — CANAGLIFLOZIN 300 MG PO TABS: 300.0000 mg | ORAL_TABLET | Freq: Every morning | ORAL | 2 refills | Status: DC

## 2016-08-12 MED ORDER — INSULIN DEGLUDEC 100 UNIT/ML ~~LOC~~ SOPN: 60.0000 [IU] | PEN_INJECTOR | Freq: Every day | SUBCUTANEOUS | 2 refills | Status: DC

## 2016-08-13 ENCOUNTER — Other Ambulatory Visit: Payer: Self-pay

## 2016-08-13 MED ORDER — INSULIN DEGLUDEC 100 UNIT/ML ~~LOC~~ SOPN: 60.0000 [IU] | PEN_INJECTOR | Freq: Every day | SUBCUTANEOUS | 2 refills | Status: DC

## 2016-08-26 ENCOUNTER — Other Ambulatory Visit: Payer: Self-pay | Admitting: "Endocrinology

## 2016-10-03 ENCOUNTER — Other Ambulatory Visit: Payer: Self-pay | Admitting: "Endocrinology

## 2016-10-10 ENCOUNTER — Encounter: Payer: Self-pay | Admitting: "Endocrinology

## 2016-10-10 ENCOUNTER — Ambulatory Visit (INDEPENDENT_AMBULATORY_CARE_PROVIDER_SITE_OTHER): Payer: BLUE CROSS/BLUE SHIELD | Admitting: "Endocrinology

## 2016-10-10 ENCOUNTER — Other Ambulatory Visit: Payer: Self-pay | Admitting: "Endocrinology

## 2016-10-10 VITALS — BP 129/78 | HR 96 | Ht 72.0 in | Wt 230.0 lb

## 2016-10-10 DIAGNOSIS — E1165 Type 2 diabetes mellitus with hyperglycemia: Secondary | ICD-10-CM

## 2016-10-10 DIAGNOSIS — Z794 Long term (current) use of insulin: Secondary | ICD-10-CM

## 2016-10-10 DIAGNOSIS — I10 Essential (primary) hypertension: Secondary | ICD-10-CM

## 2016-10-10 DIAGNOSIS — E782 Mixed hyperlipidemia: Secondary | ICD-10-CM

## 2016-10-10 DIAGNOSIS — E118 Type 2 diabetes mellitus with unspecified complications: Secondary | ICD-10-CM | POA: Diagnosis not present

## 2016-10-10 DIAGNOSIS — IMO0002 Reserved for concepts with insufficient information to code with codable children: Secondary | ICD-10-CM

## 2016-10-10 MED ORDER — INSULIN GLARGINE 300 UNIT/ML ~~LOC~~ SOPN: 70.0000 [IU] | PEN_INJECTOR | Freq: Every day | SUBCUTANEOUS | 2 refills | Status: DC

## 2016-10-10 MED ORDER — INSULIN DEGLUDEC 100 UNIT/ML ~~LOC~~ SOPN: 70.0000 [IU] | PEN_INJECTOR | Freq: Every day | SUBCUTANEOUS | 2 refills | Status: DC

## 2016-10-10 NOTE — Patient Instructions (Signed)

## 2016-10-10 NOTE — Progress Notes (Signed)
Subjective:    Patient ID: George White, male    DOB: 12/24/1960,    Past Medical History:  Diagnosis Date  . Diabetes mellitus, type II (HCC)   . Hyperlipidemia    Past Surgical History:  Procedure Laterality Date  . ROTATOR CUFF REPAIR     Social History   Social History  . Marital status: Married    Spouse name: N/A  . Number of children: N/A  . Years of education: N/A   Social History Main Topics  . Smoking status: Never Smoker  . Smokeless tobacco: Never Used  . Alcohol use No  . Drug use: No  . Sexual activity: Not Asked   Other Topics Concern  . None   Social History Narrative  . None   Outpatient Encounter Prescriptions as of 10/10/2016  Medication Sig  . BD PEN NEEDLE NANO U/F 32G X 4 MM MISC USE EVERY DAY AT BEDTIME  . canagliflozin (INVOKANA) 300 MG TABS tablet Take 1 tablet (300 mg total) by mouth every morning.  . Dulaglutide (TRULICITY) 0.75 MG/0.5ML SOPN Inject 0.75 mg into the skin once a week.  . insulin degludec (TRESIBA FLEXTOUCH) 100 UNIT/ML SOPN FlexTouch Pen Inject 0.6 mLs (60 Units total) into the skin at bedtime.  . metFORMIN (GLUCOPHAGE) 1000 MG tablet TAKE 1 TABLET BY MOUTH TWICE A DAY  . simvastatin (ZOCOR) 10 MG tablet TAKE 1 TABLET BY MOUTH AT BEDTIME  . VIAGRA 50 MG tablet TAKE 1 TABLET AS DIRECTED  . [DISCONTINUED] metFORMIN (GLUCOPHAGE) 1000 MG tablet TAKE 1 TABLET BY MOUTH TWICE A DAY   No facility-administered encounter medications on file as of 10/10/2016.    ALLERGIES: No Known Allergies VACCINATION STATUS:  There is no immunization history on file for this patient.  Diabetes  He presents for his follow-up diabetic visit. He has type 2 diabetes mellitus. Onset time: He was diagnosed at approximate age of 29 years. His disease course has been stable. There are no hypoglycemic associated symptoms. Pertinent negatives for hypoglycemia include no confusion, headaches, pallor or seizures. Pertinent negatives for diabetes  include no chest pain, no fatigue, no polydipsia, no polyphagia, no polyuria and no weakness. There are no hypoglycemic complications. Symptoms are stable. Diabetic complications include impotence. Risk factors for coronary artery disease include diabetes mellitus, dyslipidemia, hypertension, male sex and sedentary lifestyle. Current diabetic treatment includes insulin injections and oral agent (dual therapy) (As well as metformin and Invokana.). He is compliant with treatment most of the time. His weight is stable. He is following a generally healthy diet. He participates in exercise intermittently. His home blood glucose trend is increasing steadily. His breakfast blood glucose range is generally 140-180 mg/dl. His overall blood glucose range is 140-180 mg/dl. An ACE inhibitor/angiotensin II receptor blocker is not being taken.  Hyperlipidemia  This is a chronic problem. The current episode started more than 1 year ago. The problem is uncontrolled. Exacerbating diseases include diabetes and obesity. Pertinent negatives include no chest pain, myalgias or shortness of breath. Current antihyperlipidemic treatment includes statins.  Hypertension  Pertinent negatives include no chest pain, headaches, neck pain, palpitations or shortness of breath.     Review of Systems  Constitutional: Negative for fatigue and unexpected weight change.  HENT: Negative for dental problem, mouth sores and trouble swallowing.   Eyes: Negative for visual disturbance.  Respiratory: Negative for cough, choking, chest tightness, shortness of breath and wheezing.   Cardiovascular: Negative for chest pain, palpitations and leg swelling.  Gastrointestinal: Negative for abdominal distention, abdominal pain, constipation, diarrhea, nausea and vomiting.  Endocrine: Negative for polydipsia, polyphagia and polyuria.  Genitourinary: Positive for impotence. Negative for dysuria, flank pain, hematuria and urgency.  Musculoskeletal:  Negative for back pain, gait problem, myalgias and neck pain.  Skin: Negative for pallor, rash and wound.  Neurological: Negative for seizures, syncope, weakness, numbness and headaches.  Psychiatric/Behavioral: Negative.  Negative for confusion and dysphoric mood.    Objective:    BP 129/78   Pulse 96   Ht 6' (1.829 m)   Wt 230 lb (104.3 kg)   BMI 31.19 kg/m   Wt Readings from Last 3 Encounters:  10/10/16 230 lb (104.3 kg)  07/05/16 230 lb 9.6 oz (104.6 kg)  03/22/16 230 lb (104.3 kg)    Physical Exam  Constitutional: He is oriented to person, place, and time. He appears well-developed and well-nourished. He is cooperative. No distress.  HENT:  Head: Normocephalic and atraumatic.  Eyes: EOM are normal.  Neck: Normal range of motion. Neck supple. No tracheal deviation present. No thyromegaly present.  Cardiovascular: Normal rate, S1 normal, S2 normal and normal heart sounds.  Exam reveals no gallop.   No murmur heard. Pulses:      Dorsalis pedis pulses are 1+ on the right side, and 1+ on the left side.       Posterior tibial pulses are 1+ on the right side, and 1+ on the left side.  Pulmonary/Chest: Breath sounds normal. No respiratory distress. He has no wheezes.  Abdominal: Soft. Bowel sounds are normal. He exhibits no distension. There is no tenderness. There is no guarding and no CVA tenderness.  Musculoskeletal: He exhibits no edema.       Right shoulder: He exhibits no swelling and no deformity.  Neurological: He is alert and oriented to person, place, and time. He has normal strength and normal reflexes. No cranial nerve deficit or sensory deficit. Gait normal.  Skin: Skin is warm and dry. No rash noted. No cyanosis. Nails show no clubbing.  Psychiatric: He has a normal mood and affect. His speech is normal and behavior is normal. Judgment and thought content normal. Cognition and memory are normal.   No results found for this or any previous visit (from the past 2160  hour(s)).  Assessment & Plan:   1. Uncontrolled type 2 diabetes mellitus with complication, with long-term current use of insulin (HCC)  - patient remains at a high risk for more acute and chronic complications of diabetes which include CAD, CVA, CKD, retinopathy, and neuropathy. These are all discussed in detail with the patient.  Patient came with near  target glucose profile, and  recent A1c of 8% improving from 9.1% .   Glucose logs and insulin administration records pertaining to this visit,  to be scanned into patient's records.  Recent labs reviewed.   - I have re-counseled the patient on diet management and weight loss  by adopting a carbohydrate restricted / protein rich  Diet.  - Suggestion is made for patient to avoid simple carbohydrates   from their diet including Cakes , Desserts, Ice Cream,  Soda (  diet and regular) , Sweet Tea , Candies,  Chips, Cookies, Artificial Sweeteners,   and "Sugar-free" Products .  This will help patient to have stable blood glucose profile and potentially avoid unintended  Weight gain.  - Patient is advised to stick to a routine mealtimes to eat 3 meals  a day and avoid unnecessary snacks (  to snack only to correct hypoglycemia).  - The patient  will be  scheduled with Norm Salt, RDN, CDE for individualized DM education.  - I have approached patient with the following individualized plan to manage diabetes and patient agrees.  - I will increase  Tresiba to 70 units qhs,  Associated with monitoing of BG QAM.  -He will not need prandial insulin based on his progress. -Patient is encouraged to call clinic for blood glucose levels less than 70 or above 300 mg /dl. -I will continue MTF 1000mg  po BID, Invokana  300mg  po qday.  -His insurance stopped paying for Tanzeum, hence I  will   Provide samples of Trulicity  1.5 mg subcutaneous weekly .-4 weeks' samples given to patient.   - Patient specific target  for A1c; LDL, HDL, Triglycerides, and   Waist Circumference were discussed in detail.  2) BP/HTN: Uncontrolled.  I will consider low-dose ace inhibitors next visit.  3) Lipids/HPL:  continue statins. 4)  Weight/Diet: CDE consult in progress, exercise, and carbohydrates information provided.  5) Chronic Care/Health Maintenance:  -Patient is  on  medications and encouraged to continue to follow up with Ophthalmology, Podiatrist at least yearly or according to recommendations, and advised to  stay away from smoking. I have recommended yearly flu vaccine and pneumonia vaccination at least every 5 years; moderate intensity exercise for up to 150 minutes weekly; and  sleep for at least 7 hours a day. - His insurance did not pay for Viagra , still complains of erectile dysfunction . He will explore with his pharmacist what alternatives are covered by his insurance.   - 25 minutes of time was spent on the care of this patient , 50% of which was applied for counseling on diabetes complications and their preventions.   - I advised patient to maintain close follow up with their PCP for primary care needs.  - Patient is asked to bring meter and  blood glucose logs during their next visit.   Follow up plan: Return in about 3 months (around 01/08/2017) for follow up with pre-visit labs, meter, and logs.  Marquis Lunch, MD Phone: [phone]  Fax: [phone]   10/10/2016, 11:26 AM

## 2016-11-29 ENCOUNTER — Other Ambulatory Visit: Payer: Self-pay | Admitting: "Endocrinology

## 2016-12-12 ENCOUNTER — Other Ambulatory Visit: Payer: Self-pay | Admitting: "Endocrinology

## 2016-12-21 ENCOUNTER — Other Ambulatory Visit: Payer: Self-pay | Admitting: "Endocrinology

## 2017-01-06 LAB — HEMOGLOBIN A1C: Hemoglobin A1C: 7.5

## 2017-01-10 ENCOUNTER — Ambulatory Visit (INDEPENDENT_AMBULATORY_CARE_PROVIDER_SITE_OTHER): Payer: BLUE CROSS/BLUE SHIELD | Admitting: "Endocrinology

## 2017-01-10 ENCOUNTER — Encounter: Payer: Self-pay | Admitting: "Endocrinology

## 2017-01-10 VITALS — BP 136/83 | HR 103 | Ht 72.0 in | Wt 234.0 lb

## 2017-01-10 DIAGNOSIS — E1165 Type 2 diabetes mellitus with hyperglycemia: Secondary | ICD-10-CM | POA: Diagnosis not present

## 2017-01-10 DIAGNOSIS — E782 Mixed hyperlipidemia: Secondary | ICD-10-CM

## 2017-01-10 DIAGNOSIS — I10 Essential (primary) hypertension: Secondary | ICD-10-CM

## 2017-01-10 DIAGNOSIS — Z794 Long term (current) use of insulin: Secondary | ICD-10-CM

## 2017-01-10 DIAGNOSIS — E118 Type 2 diabetes mellitus with unspecified complications: Secondary | ICD-10-CM

## 2017-01-10 DIAGNOSIS — IMO0002 Reserved for concepts with insufficient information to code with codable children: Secondary | ICD-10-CM

## 2017-01-10 MED ORDER — EXENATIDE ER 2 MG/0.85ML ~~LOC~~ AUIJ: 2.0000 mg | AUTO-INJECTOR | SUBCUTANEOUS | 3 refills | Status: DC

## 2017-01-10 MED ORDER — CANAGLIFLOZIN 100 MG PO TABS: 300.0000 mg | ORAL_TABLET | Freq: Every morning | ORAL | 3 refills | Status: DC

## 2017-01-10 NOTE — Progress Notes (Signed)
Subjective:    Patient ID: George White, male    DOB: Feb 06, 1961,    Past Medical History:  Diagnosis Date  . Diabetes mellitus, type II (HCC)   . Hyperlipidemia    Past Surgical History:  Procedure Laterality Date  . ROTATOR CUFF REPAIR     Social History   Social History  . Marital status: Married    Spouse name: N/A  . Number of children: N/A  . Years of education: N/A   Social History Main Topics  . Smoking status: Never Smoker  . Smokeless tobacco: Never Used  . Alcohol use No  . Drug use: No  . Sexual activity: Not on file   Other Topics Concern  . Not on file   Social History Narrative  . No narrative on file   Outpatient Encounter Prescriptions as of 01/10/2017  Medication Sig  . BD PEN NEEDLE NANO U/F 32G X 4 MM MISC USE EVERY DAY AT BEDTIME  . canagliflozin (INVOKANA) 100 MG TABS tablet Take 3 tablets (300 mg total) by mouth every morning.  . Exenatide ER (BYDUREON BCISE) 2 MG/0.85ML AUIJ Inject 2 mg into the skin once a week.  . Insulin Glargine (TOUJEO SOLOSTAR) 300 UNIT/ML SOPN Inject 70 Units into the skin at bedtime.  . metFORMIN (GLUCOPHAGE) 1000 MG tablet TAKE 1 TABLET BY MOUTH TWICE A DAY  . simvastatin (ZOCOR) 10 MG tablet TAKE 1 TABLET BY MOUTH AT BEDTIME  . VIAGRA 50 MG tablet TAKE 1 TABLET AS DIRECTED  . [DISCONTINUED] Dulaglutide (TRULICITY) 0.75 MG/0.5ML SOPN Inject 0.75 mg into the skin once a week.  . [DISCONTINUED] INVOKANA 300 MG TABS tablet TAKE 1 TABLET BY MOUTH EVERY MORNING   No facility-administered encounter medications on file as of 01/10/2017.    ALLERGIES: No Known Allergies VACCINATION STATUS:  There is no immunization history on file for this patient.  Diabetes  He presents for his follow-up diabetic visit. He has type 2 diabetes mellitus. Onset time: He was diagnosed at approximate age of 29 years. His disease course has been improving. There are no hypoglycemic associated symptoms. Pertinent negatives for  hypoglycemia include no confusion, headaches, pallor or seizures. Pertinent negatives for diabetes include no chest pain, no fatigue, no polydipsia, no polyphagia, no polyuria and no weakness. There are no hypoglycemic complications. Symptoms are improving. Diabetic complications include impotence. Risk factors for coronary artery disease include diabetes mellitus, dyslipidemia, hypertension, male sex and sedentary lifestyle. Current diabetic treatment includes insulin injections and oral agent (dual therapy) (As well as metformin and Invokana.). He is compliant with treatment most of the time. His weight is stable. He is following a generally healthy diet. He participates in exercise intermittently. His home blood glucose trend is increasing steadily. His breakfast blood glucose range is generally 140-180 mg/dl. His overall blood glucose range is 140-180 mg/dl. An ACE inhibitor/angiotensin II receptor blocker is not being taken.  Hyperlipidemia  This is a chronic problem. The current episode started more than 1 year ago. The problem is uncontrolled. Exacerbating diseases include diabetes and obesity. Pertinent negatives include no chest pain, myalgias or shortness of breath. Current antihyperlipidemic treatment includes statins.  Hypertension  Pertinent negatives include no chest pain, headaches, neck pain, palpitations or shortness of breath.     Review of Systems  Constitutional: Negative for fatigue and unexpected weight change.  HENT: Negative for dental problem, mouth sores and trouble swallowing.   Eyes: Negative for visual disturbance.  Respiratory: Negative for cough, choking,  chest tightness, shortness of breath and wheezing.   Cardiovascular: Negative for chest pain, palpitations and leg swelling.  Gastrointestinal: Negative for abdominal distention, abdominal pain, constipation, diarrhea, nausea and vomiting.  Endocrine: Negative for polydipsia, polyphagia and polyuria.  Genitourinary:  Positive for impotence. Negative for dysuria, flank pain, hematuria and urgency.  Musculoskeletal: Negative for back pain, gait problem, myalgias and neck pain.  Skin: Negative for pallor, rash and wound.  Neurological: Negative for seizures, syncope, weakness, numbness and headaches.  Psychiatric/Behavioral: Negative.  Negative for confusion and dysphoric mood.    Objective:    BP 136/83   Pulse (!) 103   Ht 6' (1.829 m)   Wt 234 lb (106.1 kg)   BMI 31.74 kg/m   Wt Readings from Last 3 Encounters:  01/10/17 234 lb (106.1 kg)  10/10/16 230 lb (104.3 kg)  07/05/16 230 lb 9.6 oz (104.6 kg)    Physical Exam  Constitutional: He is oriented to person, place, and time. He appears well-developed and well-nourished. He is cooperative. No distress.  HENT:  Head: Normocephalic and atraumatic.  Eyes: EOM are normal.  Neck: Normal range of motion. Neck supple. No tracheal deviation present. No thyromegaly present.  Cardiovascular: Normal rate, S1 normal, S2 normal and normal heart sounds.  Exam reveals no gallop.   No murmur heard. Pulses:      Dorsalis pedis pulses are 1+ on the right side, and 1+ on the left side.       Posterior tibial pulses are 1+ on the right side, and 1+ on the left side.  Pulmonary/Chest: Breath sounds normal. No respiratory distress. He has no wheezes.  Abdominal: Soft. Bowel sounds are normal. He exhibits no distension. There is no tenderness. There is no guarding and no CVA tenderness.  Musculoskeletal: He exhibits no edema.       Right shoulder: He exhibits no swelling and no deformity.  Neurological: He is alert and oriented to person, place, and time. He has normal strength and normal reflexes. No cranial nerve deficit or sensory deficit. Gait normal.  Skin: Skin is warm and dry. No rash noted. No cyanosis. Nails show no clubbing.  Psychiatric: He has a normal mood and affect. His speech is normal and behavior is normal. Judgment and thought content normal.  Cognition and memory are normal.    -A1c from 01/06/2017 was 7.5% progressively improving from 9.1%. - His labs also show normal renal function.  Assessment & Plan:   1. Uncontrolled type 2 diabetes mellitus with complication, with long-term current use of insulin (HCC)  - Patient remains at a high risk for more acute and chronic complications of diabetes which include CAD, CVA, CKD, retinopathy, and neuropathy. These are all discussed in detail with the patient.  Patient came with near  target glucose profile, and  recent A1c of 7.5%, progressively improving from 9.1% .   Glucose logs and insulin administration records pertaining to this visit,  to be scanned into patient's records.  Recent labs reviewed.   - I have re-counseled the patient on diet management and weight loss  by adopting a carbohydrate restricted / protein rich  Diet.  - Suggestion is made for patient to avoid simple carbohydrates   from their diet including Cakes , Desserts, Ice Cream,  Soda (  diet and regular) , Sweet Tea , Candies,  Chips, Cookies, Artificial Sweeteners,   and "Sugar-free" Products .  This will help patient to have stable blood glucose profile and potentially avoid unintended  Weight gain.  - Patient is advised to stick to a routine mealtimes to eat 3 meals  a day and avoid unnecessary snacks ( to snack only to correct hypoglycemia).  - The patient  will be  scheduled with Norm Salt, RDN, CDE for individualized DM education.  - I have approached patient with the following individualized plan to manage diabetes and patient agrees.  - I will continue Tresiba  70 units qhs,  Associated with monitoing of BG QAM.  -He will not need prandial insulin based on his progress. -Patient is encouraged to call clinic for blood glucose levels less than 70 or above 300 mg /dl. -I will continue MTF 1000mg  po BID, lower Invokana  To 100mg  po qday.  -His insurance stopped paying for Tanzeum, I will try a  prescription for Bydureon 2 mg weekly . I  will   Provide samples of Trulicity  1.5 mg subcutaneous weekly .   - Patient specific target  for A1c; LDL, HDL, Triglycerides, and  Waist Circumference were discussed in detail.  2) BP/HTN: Uncontrolled.  I will consider low-dose ace inhibitors next visit.  3) Lipids/HPL:  continue statins. 4)  Weight/Diet: CDE consult in progress, exercise, and carbohydrates information provided.  5) Chronic Care/Health Maintenance:  -Patient is  on  medications and encouraged to continue to follow up with Ophthalmology, Podiatrist at least yearly or according to recommendations, and advised to  stay away from smoking. I have recommended yearly flu vaccine and pneumonia vaccination at least every 5 years; moderate intensity exercise for up to 150 minutes weekly; and  sleep for at least 7 hours a day. - His insurance did not pay for Viagra , still complains of erectile dysfunction . He will explore with his pharmacist what alternatives are covered by his insurance.   - 25 minutes of time was spent on the care of this patient , 50% of which was applied for counseling on diabetes complications and their preventions.   - I advised patient to maintain close follow up with their PCP for primary care needs.  - Patient is asked to bring meter and  blood glucose logs during their next visit.   Follow up plan: Return in about 3 months (around 04/12/2017) for meter, and logs.  Marquis Lunch, MD Phone: [phone]  Fax: [phone]   01/10/2017, 12:27 PM

## 2017-01-10 NOTE — Patient Instructions (Signed)

## 2017-03-05 ENCOUNTER — Other Ambulatory Visit: Payer: Self-pay | Admitting: "Endocrinology

## 2017-03-14 ENCOUNTER — Other Ambulatory Visit: Payer: Self-pay | Admitting: "Endocrinology

## 2017-03-20 ENCOUNTER — Other Ambulatory Visit: Payer: Self-pay | Admitting: "Endocrinology

## 2017-03-20 ENCOUNTER — Other Ambulatory Visit: Payer: Self-pay

## 2017-03-20 MED ORDER — CANAGLIFLOZIN 100 MG PO TABS: 300.0000 mg | ORAL_TABLET | Freq: Every morning | ORAL | 0 refills | Status: DC

## 2017-03-20 MED ORDER — CANAGLIFLOZIN 100 MG PO TABS
300.0000 mg | ORAL_TABLET | Freq: Every morning | ORAL | 3 refills | Status: DC
Start: 2017-03-20 — End: 2017-03-20

## 2017-04-07 ENCOUNTER — Encounter: Payer: Self-pay | Admitting: "Endocrinology

## 2017-04-07 LAB — BASIC METABOLIC PANEL
BUN: 13 (ref 4–21)
Creatinine: 0.9 (ref <=1.3)

## 2017-04-07 LAB — HEMOGLOBIN A1C: Hemoglobin A1C: 7.8

## 2017-04-11 ENCOUNTER — Encounter: Payer: Self-pay | Admitting: "Endocrinology

## 2017-04-11 ENCOUNTER — Ambulatory Visit (INDEPENDENT_AMBULATORY_CARE_PROVIDER_SITE_OTHER): Payer: BLUE CROSS/BLUE SHIELD | Admitting: "Endocrinology

## 2017-04-11 VITALS — BP 131/84 | HR 90 | Ht 72.0 in | Wt 234.0 lb

## 2017-04-11 DIAGNOSIS — Z6831 Body mass index (BMI) 31.0-31.9, adult: Secondary | ICD-10-CM | POA: Diagnosis not present

## 2017-04-11 DIAGNOSIS — E6609 Other obesity due to excess calories: Secondary | ICD-10-CM | POA: Diagnosis not present

## 2017-04-11 DIAGNOSIS — E118 Type 2 diabetes mellitus with unspecified complications: Secondary | ICD-10-CM

## 2017-04-11 DIAGNOSIS — I10 Essential (primary) hypertension: Secondary | ICD-10-CM

## 2017-04-11 DIAGNOSIS — Z794 Long term (current) use of insulin: Secondary | ICD-10-CM

## 2017-04-11 DIAGNOSIS — IMO0002 Reserved for concepts with insufficient information to code with codable children: Secondary | ICD-10-CM

## 2017-04-11 DIAGNOSIS — E1165 Type 2 diabetes mellitus with hyperglycemia: Secondary | ICD-10-CM | POA: Diagnosis not present

## 2017-04-11 DIAGNOSIS — E782 Mixed hyperlipidemia: Secondary | ICD-10-CM | POA: Diagnosis not present

## 2017-04-11 MED ORDER — CANAGLIFLOZIN 100 MG PO TABS: 100.0000 mg | ORAL_TABLET | Freq: Every morning | ORAL | 0 refills | Status: DC

## 2017-04-11 MED ORDER — SILDENAFIL CITRATE 50 MG PO TABS: 50.0000 mg | ORAL_TABLET | Freq: Every day | ORAL | 2 refills | Status: DC | PRN

## 2017-04-11 NOTE — Patient Instructions (Signed)

## 2017-04-11 NOTE — Progress Notes (Signed)
Subjective:    Patient ID: George White, male    DOB: [birth year],    Past Medical History:  Diagnosis Date  . Diabetes mellitus, type II (HCC)   . Hyperlipidemia    Past Surgical History:  Procedure Laterality Date  . ROTATOR CUFF REPAIR     Social History   Social History  . Marital status: Married    Spouse name: N/A  . Number of children: N/A  . Years of education: N/A   Social History Main Topics  . Smoking status: Never Smoker  . Smokeless tobacco: Never Used  . Alcohol use No  . Drug use: No  . Sexual activity: Not Asked   Other Topics Concern  . None   Social History Narrative  . None   Outpatient Encounter Prescriptions as of 04/11/2017  Medication Sig  . BD PEN NEEDLE NANO U/F 32G X 4 MM MISC USE EVERY DAY AT BEDTIME  . canagliflozin (INVOKANA) 100 MG TABS tablet Take 1 tablet (100 mg total) by mouth every morning.  . Exenatide ER (BYDUREON BCISE) 2 MG/0.85ML AUIJ Inject 2 mg into the skin once a week.  . Insulin Glargine (TOUJEO SOLOSTAR) 300 UNIT/ML SOPN Inject 70 Units into the skin at bedtime.  . metFORMIN (GLUCOPHAGE) 1000 MG tablet TAKE 1 TABLET BY MOUTH TWICE A DAY  . sildenafil (VIAGRA) 50 MG tablet Take 1 tablet (50 mg total) by mouth daily as needed for erectile dysfunction.  . simvastatin (ZOCOR) 10 MG tablet TAKE 1 TABLET BY MOUTH AT BEDTIME  . [DISCONTINUED] canagliflozin (INVOKANA) 100 MG TABS tablet Take 3 tablets (300 mg total) by mouth every morning.  . [DISCONTINUED] VIAGRA 50 MG tablet TAKE 1 TABLET AS DIRECTED   No facility-administered encounter medications on file as of 04/11/2017.    ALLERGIES: No Known Allergies VACCINATION STATUS:  There is no immunization history on file for this patient.  Diabetes  He presents for his follow-up diabetic visit. He has type 2 diabetes mellitus. Onset time: He was diagnosed at approximate age of 29 years. His disease course has been improving. There are no hypoglycemic associated symptoms.  Pertinent negatives for hypoglycemia include no confusion, headaches, pallor or seizures. Pertinent negatives for diabetes include no chest pain, no fatigue, no polydipsia, no polyphagia, no polyuria and no weakness. There are no hypoglycemic complications. Symptoms are improving. Diabetic complications include impotence. Risk factors for coronary artery disease include diabetes mellitus, dyslipidemia, hypertension, male sex and sedentary lifestyle. Current diabetic treatment includes insulin injections and oral agent (dual therapy) (As well as metformin and Invokana.). He is compliant with treatment most of the time. His weight is stable. He is following a generally healthy diet. He participates in exercise intermittently. His home blood glucose trend is increasing steadily. His breakfast blood glucose range is generally 140-180 mg/dl. His overall blood glucose range is 140-180 mg/dl. An ACE inhibitor/angiotensin II receptor blocker is not being taken.  Hyperlipidemia  This is a chronic problem. The current episode started more than 1 year ago. The problem is uncontrolled. Exacerbating diseases include diabetes and obesity. Pertinent negatives include no chest pain, myalgias or shortness of breath. Current antihyperlipidemic treatment includes statins.  Hypertension  Pertinent negatives include no chest pain, headaches, neck pain, palpitations or shortness of breath.     Review of Systems  Constitutional: Negative for fatigue and unexpected weight change.  HENT: Negative for dental problem, mouth sores and trouble swallowing.   Eyes: Negative for visual disturbance.  Respiratory: Negative  for cough, choking, chest tightness, shortness of breath and wheezing.   Cardiovascular: Negative for chest pain, palpitations and leg swelling.  Gastrointestinal: Negative for abdominal distention, abdominal pain, constipation, diarrhea, nausea and vomiting.  Endocrine: Negative for polydipsia, polyphagia and  polyuria.  Genitourinary: Positive for impotence. Negative for dysuria, flank pain, hematuria and urgency.  Musculoskeletal: Negative for back pain, gait problem, myalgias and neck pain.  Skin: Negative for pallor, rash and wound.  Neurological: Negative for seizures, syncope, weakness, numbness and headaches.  Psychiatric/Behavioral: Negative.  Negative for confusion and dysphoric mood.    Objective:    BP 131/84   Pulse 90   Ht 6' (1.829 m)   Wt 234 lb (106.1 kg)   BMI 31.74 kg/m   Wt Readings from Last 3 Encounters:  04/11/17 234 lb (106.1 kg)  01/10/17 234 lb (106.1 kg)  10/10/16 230 lb (104.3 kg)    Physical Exam  Constitutional: He is oriented to person, place, and time. He appears well-developed and well-nourished. He is cooperative. No distress.  HENT:  Head: Normocephalic and atraumatic.  Eyes: EOM are normal.  Neck: Normal range of motion. Neck supple. No tracheal deviation present. No thyromegaly present.  Cardiovascular: Normal rate, S1 normal, S2 normal and normal heart sounds.  Exam reveals no gallop.   No murmur heard. Pulses:      Dorsalis pedis pulses are 1+ on the right side, and 1+ on the left side.       Posterior tibial pulses are 1+ on the right side, and 1+ on the left side.  Pulmonary/Chest: Breath sounds normal. No respiratory distress. He has no wheezes.  Abdominal: Soft. Bowel sounds are normal. He exhibits no distension. There is no tenderness. There is no guarding and no CVA tenderness.  Musculoskeletal: He exhibits no edema.       Right shoulder: He exhibits no swelling and no deformity.  Neurological: He is alert and oriented to person, place, and time. He has normal strength and normal reflexes. No cranial nerve deficit or sensory deficit. Gait normal.  Skin: Skin is warm and dry. No rash noted. No cyanosis. Nails show no clubbing.  Psychiatric: He has a normal mood and affect. His speech is normal and behavior is normal. Judgment and thought  content normal. Cognition and memory are normal.   Recent Results (from the past 2160 hour(s))  Basic metabolic panel     Status: None   Collection Time: 04/07/17 12:00 AM  Result Value Ref Range   BUN 13 4 - 21   Creatinine 0.9 0.6 - 1.3  Hemoglobin A1c     Status: None   Collection Time: 04/07/17 12:00 AM  Result Value Ref Range   Hemoglobin A1C 7.8      - His labs also show normal renal function.  Assessment & Plan:   1. Uncontrolled type 2 diabetes mellitus with complication, with long-term current use of insulin (HCC)  - Patient remains at a high risk for more acute and chronic complications of diabetes which include CAD, CVA, CKD, retinopathy, and neuropathy. These are all discussed in detail with the patient.  Patient came with Stable A1c of 7.8%, progressively improving from 9.1% .  Recent labs reviewed.   - I have re-counseled the patient on diet management and weight loss  by adopting a carbohydrate restricted / protein rich  Diet.  - Suggestion is made for patient to avoid simple carbohydrates   from his diet including Cakes , Desserts, Ice Cream,  Soda (  diet and regular) , Sweet Tea , Candies,  Chips, Cookies, Artificial Sweeteners,   and "Sugar-free" Products .  This will help patient to have stable blood glucose profile and potentially avoid unintended  Weight gain.  - Patient is advised to stick to a routine mealtimes to eat 3 meals  a day and avoid unnecessary snacks ( to snack only to correct hypoglycemia).  - The patient  will be  scheduled with Norm Salt, RDN, CDE for individualized DM education.  - I have approached patient with the following individualized plan to manage diabetes and patient agrees.  - I will continue Toujeo 70 units qhs,  Associated with monitoing of BG QAM.  -He will not need prandial insulin based on his progress. -Patient is encouraged to call clinic for blood glucose levels less than 70 or above 300 mg /dl. -I will continue  MTF 1000mg  po BID, lower Invokana  To 100mg  po qday.  - I will continue Bydureon 2 mg weekly .  - Patient specific target  for A1c; LDL, HDL, Triglycerides, and  Waist Circumference were discussed in detail.  2) BP/HTN: Uncontrolled.  I will consider low-dose ace inhibitors next visit.  3) Lipids/HPL:  continue statins. 4)  Weight/Diet: CDE consult in progress, exercise, and carbohydrates information provided.  5) erectile dysfunction: He is benefiting from sildenafil therapy. I will refill sildenafil 50 mg by mouth when necessary.  6) Chronic Care/Health Maintenance: -Patient is  on  medications and encouraged to continue to follow up with Ophthalmology, Podiatrist at least yearly or according to recommendations, and advised to  stay away from smoking. I have recommended yearly flu vaccine and pneumonia vaccination at least every 5 years; moderate intensity exercise for up to 150 minutes weekly; and  sleep for at least 7 hours a day. - His insurance did not pay for Viagra , still complains of erectile dysfunction . He will explore with his pharmacist what alternatives are covered by his insurance.   - 25 minutes of time was spent on the care of this patient , 50% of which was applied for counseling on diabetes complications and their preventions.   - I advised patient to maintain close follow up with their PCP for primary care needs.  - Patient is asked to bring meter and  blood glucose logs during his next visit.   Follow up plan: Return in about 3 months (around 07/12/2017) for meter, and logs.  Marquis Lunch, MD Phone: [phone]  Fax: [phone]   04/11/2017, 11:18 AM

## 2017-06-16 ENCOUNTER — Other Ambulatory Visit: Payer: Self-pay | Admitting: "Endocrinology

## 2017-06-28 ENCOUNTER — Other Ambulatory Visit: Payer: Self-pay | Admitting: "Endocrinology

## 2017-07-03 LAB — BASIC METABOLIC PANEL
BUN: 13 (ref 4–21)
Creatinine: 0.7 (ref <=1.3)

## 2017-07-03 LAB — HEMOGLOBIN A1C: Hemoglobin A1C: 8.9

## 2017-07-03 LAB — LIPID PANEL
Cholesterol: 126 (ref 0–200)
HDL: 21 — AB (ref 35–70)
LDL Cholesterol: 70
Triglycerides: 212 — AB (ref 40–160)

## 2017-07-03 LAB — TSH: TSH: 2.97 (ref <=5.90)

## 2017-07-05 ENCOUNTER — Other Ambulatory Visit: Payer: Self-pay | Admitting: "Endocrinology

## 2017-07-17 ENCOUNTER — Ambulatory Visit: Payer: BLUE CROSS/BLUE SHIELD | Admitting: "Endocrinology

## 2017-07-29 ENCOUNTER — Encounter: Payer: Self-pay | Admitting: "Endocrinology

## 2017-07-29 ENCOUNTER — Ambulatory Visit (INDEPENDENT_AMBULATORY_CARE_PROVIDER_SITE_OTHER): Payer: BLUE CROSS/BLUE SHIELD | Admitting: "Endocrinology

## 2017-07-29 VITALS — BP 132/71 | HR 93 | Ht 72.0 in | Wt 227.0 lb

## 2017-07-29 DIAGNOSIS — Z6831 Body mass index (BMI) 31.0-31.9, adult: Secondary | ICD-10-CM

## 2017-07-29 DIAGNOSIS — E6609 Other obesity due to excess calories: Secondary | ICD-10-CM

## 2017-07-29 DIAGNOSIS — I10 Essential (primary) hypertension: Secondary | ICD-10-CM

## 2017-07-29 DIAGNOSIS — E782 Mixed hyperlipidemia: Secondary | ICD-10-CM

## 2017-07-29 DIAGNOSIS — E118 Type 2 diabetes mellitus with unspecified complications: Secondary | ICD-10-CM

## 2017-07-29 DIAGNOSIS — IMO0002 Reserved for concepts with insufficient information to code with codable children: Secondary | ICD-10-CM

## 2017-07-29 DIAGNOSIS — Z794 Long term (current) use of insulin: Secondary | ICD-10-CM | POA: Diagnosis not present

## 2017-07-29 DIAGNOSIS — E1165 Type 2 diabetes mellitus with hyperglycemia: Secondary | ICD-10-CM

## 2017-07-29 NOTE — Progress Notes (Signed)
Subjective:    Patient ID: George White, male    DOB: [birth year],    Past Medical History:  Diagnosis Date  . Diabetes mellitus, type II (HCC)   . Hyperlipidemia    Past Surgical History:  Procedure Laterality Date  . ROTATOR CUFF REPAIR     Social History   Social History  . Marital status: Married    Spouse name: N/A  . Number of children: N/A  . Years of education: N/A   Social History Main Topics  . Smoking status: Never Smoker  . Smokeless tobacco: Never Used  . Alcohol use No  . Drug use: No  . Sexual activity: Not Asked   Other Topics Concern  . None   Social History Narrative  . None   Outpatient Encounter Prescriptions as of 07/29/2017  Medication Sig  . Dulaglutide (TRULICITY) 1.5 MG/0.5ML SOPN Inject into the skin once a week.  . Insulin Detemir (LEVEMIR FLEXTOUCH) 100 UNIT/ML Pen Inject 80 Units into the skin at bedtime.  . BD PEN NEEDLE NANO U/F 32G X 4 MM MISC USE EVERY DAY AT BEDTIME  . canagliflozin (INVOKANA) 100 MG TABS tablet Take 1 tablet (100 mg total) by mouth every morning.  . metFORMIN (GLUCOPHAGE) 1000 MG tablet TAKE 1 TABLET BY MOUTH TWICE A DAY  . sildenafil (VIAGRA) 50 MG tablet Take 1 tablet (50 mg total) by mouth daily as needed for erectile dysfunction.  . simvastatin (ZOCOR) 10 MG tablet TAKE 1 TABLET BY MOUTH AT BEDTIME  . [DISCONTINUED] Exenatide ER (BYDUREON BCISE) 2 MG/0.85ML AUIJ Inject 2 mg into the skin once a week.  . [DISCONTINUED] Insulin Glargine (TOUJEO SOLOSTAR) 300 UNIT/ML SOPN Inject 70 Units into the skin at bedtime.  . [DISCONTINUED] INVOKANA 300 MG TABS tablet TAKE 1 TABLET BY MOUTH EVERY DAY IN THE MORNING   No facility-administered encounter medications on file as of 07/29/2017.    ALLERGIES: No Known Allergies VACCINATION STATUS:  There is no immunization history on file for this patient.  Diabetes  He presents for his follow-up diabetic visit. He has type 2 diabetes mellitus. Onset time: He was  diagnosed at approximate age of 29 years. His disease course has been worsening. There are no hypoglycemic associated symptoms. Pertinent negatives for hypoglycemia include no confusion, headaches, pallor or seizures. Associated symptoms include polydipsia and polyuria. Pertinent negatives for diabetes include no chest pain, no fatigue, no polyphagia and no weakness. There are no hypoglycemic complications. Symptoms are worsening. Diabetic complications include impotence. Risk factors for coronary artery disease include diabetes mellitus, dyslipidemia, hypertension, male sex and sedentary lifestyle. Current diabetic treatment includes insulin injections and oral agent (dual therapy) (As well as metformin and Invokana.). He is compliant with treatment most of the time. His weight is decreasing steadily. He is following a generally healthy diet. He participates in exercise intermittently. His home blood glucose trend is increasing steadily. His breakfast blood glucose range is generally >200 mg/dl. His overall blood glucose range is >200 mg/dl. An ACE inhibitor/angiotensin II receptor blocker is not being taken.  Hyperlipidemia  This is a chronic problem. The current episode started more than 1 year ago. The problem is uncontrolled. Exacerbating diseases include diabetes and obesity. Pertinent negatives include no chest pain, myalgias or shortness of breath. Current antihyperlipidemic treatment includes statins. Risk factors for coronary artery disease include diabetes mellitus, dyslipidemia, hypertension and a sedentary lifestyle.  Hypertension  This is a chronic problem. The current episode started more than 1 year  ago. Pertinent negatives include no chest pain, headaches, neck pain, palpitations or shortness of breath. Risk factors for coronary artery disease include dyslipidemia, diabetes mellitus, obesity and sedentary lifestyle.     Review of Systems  Constitutional: Negative for fatigue and unexpected  weight change.  HENT: Negative for dental problem, mouth sores and trouble swallowing.   Eyes: Negative for visual disturbance.  Respiratory: Negative for cough, choking, chest tightness, shortness of breath and wheezing.   Cardiovascular: Negative for chest pain, palpitations and leg swelling.  Gastrointestinal: Negative for abdominal distention, abdominal pain, constipation, diarrhea, nausea and vomiting.  Endocrine: Positive for polydipsia and polyuria. Negative for polyphagia.  Genitourinary: Positive for impotence. Negative for dysuria, flank pain, hematuria and urgency.  Musculoskeletal: Negative for back pain, gait problem, myalgias and neck pain.  Skin: Negative for pallor, rash and wound.  Neurological: Negative for seizures, syncope, weakness, numbness and headaches.  Psychiatric/Behavioral: Negative.  Negative for confusion and dysphoric mood.    Objective:    BP 132/71   Pulse 93   Ht 6' (1.829 m)   Wt 227 lb (103 kg)   BMI 30.79 kg/m   Wt Readings from Last 3 Encounters:  07/29/17 227 lb (103 kg)  04/11/17 234 lb (106.1 kg)  01/10/17 234 lb (106.1 kg)    Physical Exam  Constitutional: He is oriented to person, place, and time. He appears well-developed and well-nourished. He is cooperative. No distress.  HENT:  Head: Normocephalic and atraumatic.  Eyes: EOM are normal.  Neck: Normal range of motion. Neck supple. No tracheal deviation present. No thyromegaly present.  Cardiovascular: Normal rate, S1 normal, S2 normal and normal heart sounds.  Exam reveals no gallop.   No murmur heard. Pulses:      Dorsalis pedis pulses are 1+ on the right side, and 1+ on the left side.       Posterior tibial pulses are 1+ on the right side, and 1+ on the left side.  Pulmonary/Chest: Breath sounds normal. No respiratory distress. He has no wheezes.  Abdominal: Soft. Bowel sounds are normal. He exhibits no distension. There is no tenderness. There is no guarding and no CVA tenderness.   Musculoskeletal: He exhibits no edema.       Right shoulder: He exhibits no swelling and no deformity.  Neurological: He is alert and oriented to person, place, and time. He has normal strength and normal reflexes. No cranial nerve deficit or sensory deficit. Gait normal.  Skin: Skin is warm and dry. No rash noted. No cyanosis. Nails show no clubbing.  Psychiatric: He has a normal mood and affect. His speech is normal and behavior is normal. Judgment and thought content normal. Cognition and memory are normal.   Recent Results (from the past 2160 hour(s))  Basic metabolic panel     Status: None   Collection Time: 07/03/17 12:00 AM  Result Value Ref Range   BUN 13 4 - 21   Creatinine 0.7 0.6 - 1.3  Lipid panel     Status: Abnormal   Collection Time: 07/03/17 12:00 AM  Result Value Ref Range   Triglycerides 212 (A) 40 - 160   Cholesterol 126 0 - 200   HDL 21 (A) 35 - 70   LDL Cholesterol 70   Hemoglobin A1c     Status: None   Collection Time: 07/03/17 12:00 AM  Result Value Ref Range   Hemoglobin A1C 8.9   TSH     Status: None   Collection Time: 07/03/17  12:00 AM  Result Value Ref Range   TSH 2.97 0.41 - 5.90     - His labs also show normal renal function.  Assessment & Plan:   1. Uncontrolled type 2 diabetes mellitus with complication, with long-term current use of insulin (HCC)  - Patient remains at a high risk for more acute and chronic complications of diabetes which include CAD, CVA, CKD, retinopathy, and neuropathy. These are all discussed in detail with the patient.  Patient came with  loss of control of diabetes with A1c rising to 8.9% from 7.8%. This is due to multiple social reasons that he had to encounter since last visit including the death of his mother and mother-in-law and illness of his father. This has distracted his routine treatment regimen.   Recent labs reviewed, showing normal renal function.  - I have re-counseled the patient on diet management and  weight loss  by adopting a carbohydrate restricted / protein rich  Diet.  Suggestion is made for him to avoid simple carbohydrates  from his diet including Cakes, Sweet Desserts, Ice Cream, Soda (diet and regular), Sweet Tea, Candies, Chips, Cookies, Store Bought Juices, Alcohol in Excess of  1-2 drinks a day, Artificial Sweeteners, and "Sugar-free" Products. This will help patient to have stable blood glucose profile and potentially avoid unintended weight gain.   - Patient is advised to stick to a routine mealtimes to eat 3 meals  a day and avoid unnecessary snacks ( to snack only to correct hypoglycemia).   - I have approached patient with the following individualized plan to manage diabetes and patient agrees.  - He may require intensive treatment with basal/bolus insulin to achieve control of diabetes to target. - However, he wishes to get treatment simple for at least until next visit. - I will increase his Levemir to 80 units daily at bedtime, associated with monitoing of  blood glucose at least daily before breakfast.   -Patient is encouraged to call clinic for blood glucose levels less than 70 or above 300 mg /dl. -I will continue metformin  by mouth twice a day, discontinue Invokana.   - I will continue Trulicity 1.5 mg weekly.  - Patient specific target  for A1c; LDL, HDL, Triglycerides, and  Waist Circumference were discussed in detail.  2) BP/HTN: controlled.  I will consider low-dose ace inhibitors next visit.  3) Lipids/HPL:  continue statins. 4)  Weight/Diet: CDE consult in progress, exercise, and carbohydrates information provided.  5) erectile dysfunction: He is benefiting from sildenafil therapy. I will refill sildenafil 50 mg by mouth when necessary.  6) Chronic Care/Health Maintenance: -Patient is  on  medications and encouraged to continue to follow up with Ophthalmology, Podiatrist at least yearly or according to recommendations, and advised to  stay away from  smoking. I have recommended yearly flu vaccine and pneumonia vaccination at least every 5 years; moderate intensity exercise for up to 150 minutes weekly; and  sleep for at least 7 hours a day.    - Time spent with the patient: 25 min, of which >50% was spent in reviewing his sugar logs , discussing his hypo- and hyper-glycemic episodes, reviewing his current and  previous labs and insulin doses and developing a plan to avoid hypo- and hyper-glycemia. \    - I advised patient to maintain close follow up with his PCP for primary care needs.    Follow up plan: Return in about 3 months (around 10/29/2017) for follow up with pre-visit labs, meter,  and logs.  George Lunch, MD Phone: [phone]  Fax: [phone]   This note was partially dictated with voice recognition software. Similar sounding words can be transcribed inadequately or may not  be corrected upon review.  07/29/2017, 11:44 AM

## 2017-07-29 NOTE — Patient Instructions (Signed)

## 2017-08-20 ENCOUNTER — Telehealth: Payer: Self-pay | Admitting: "Endocrinology

## 2017-08-20 NOTE — Telephone Encounter (Signed)
George White  is already on Levemir 80 units daily at bedtime along with his Trulicity , and metformin. He still runs hyperglycemia, he may need a short-acting insulin added. - I advised him to start monitoring blood glucose 4 times a day-before meals and at bedtime and return early next week with his meter and logs for decision.

## 2017-08-20 NOTE — Telephone Encounter (Signed)
George White is calling stating that his Hgb A1c has been running high and its still not where it needs to be and he is asking if Dr. Fransico Him would consider putting him on a long lasting insulin , please advise

## 2017-08-22 NOTE — Telephone Encounter (Signed)
George White is coming in Monday 08/25/17 with his meter & readings

## 2017-08-25 ENCOUNTER — Ambulatory Visit (INDEPENDENT_AMBULATORY_CARE_PROVIDER_SITE_OTHER): Payer: BLUE CROSS/BLUE SHIELD | Admitting: "Endocrinology

## 2017-08-25 ENCOUNTER — Encounter: Payer: Self-pay | Admitting: "Endocrinology

## 2017-08-25 VITALS — BP 127/68 | HR 99 | Ht 72.0 in | Wt 243.0 lb

## 2017-08-25 DIAGNOSIS — E118 Type 2 diabetes mellitus with unspecified complications: Secondary | ICD-10-CM

## 2017-08-25 DIAGNOSIS — E1165 Type 2 diabetes mellitus with hyperglycemia: Secondary | ICD-10-CM

## 2017-08-25 DIAGNOSIS — E6609 Other obesity due to excess calories: Secondary | ICD-10-CM | POA: Diagnosis not present

## 2017-08-25 DIAGNOSIS — IMO0002 Reserved for concepts with insufficient information to code with codable children: Secondary | ICD-10-CM

## 2017-08-25 DIAGNOSIS — I10 Essential (primary) hypertension: Secondary | ICD-10-CM

## 2017-08-25 DIAGNOSIS — E782 Mixed hyperlipidemia: Secondary | ICD-10-CM | POA: Diagnosis not present

## 2017-08-25 DIAGNOSIS — Z794 Long term (current) use of insulin: Secondary | ICD-10-CM | POA: Diagnosis not present

## 2017-08-25 DIAGNOSIS — Z6831 Body mass index (BMI) 31.0-31.9, adult: Secondary | ICD-10-CM | POA: Diagnosis not present

## 2017-08-25 NOTE — Patient Instructions (Signed)

## 2017-08-25 NOTE — Progress Notes (Signed)
Subjective:    Patient ID: George White, male    DOB: 1961/03/04,    Past Medical History:  Diagnosis Date  . Diabetes mellitus, type II (HCC)   . Hyperlipidemia    Past Surgical History:  Procedure Laterality Date  . ROTATOR CUFF REPAIR     Social History   Social History  . Marital status: Married    Spouse name: N/A  . Number of children: N/A  . Years of education: N/A   Social History Main Topics  . Smoking status: Never Smoker  . Smokeless tobacco: Never Used  . Alcohol use No  . Drug use: No  . Sexual activity: Not Asked   Other Topics Concern  . None   Social History Narrative  . None   Outpatient Encounter Prescriptions as of 08/25/2017  Medication Sig  . BD PEN NEEDLE NANO U/F 32G X 4 MM MISC USE EVERY DAY AT BEDTIME  . Dulaglutide (TRULICITY) 1.5 MG/0.5ML SOPN Inject into the skin once a week.  . Insulin Detemir (LEVEMIR FLEXTOUCH) 100 UNIT/ML Pen Inject 80 Units into the skin at bedtime.  . metFORMIN (GLUCOPHAGE) 1000 MG tablet TAKE 1 TABLET BY MOUTH TWICE A DAY  . sildenafil (VIAGRA) 50 MG tablet Take 1 tablet (50 mg total) by mouth daily as needed for erectile dysfunction.  . simvastatin (ZOCOR) 10 MG tablet TAKE 1 TABLET BY MOUTH AT BEDTIME  . [DISCONTINUED] canagliflozin (INVOKANA) 100 MG TABS tablet Take 1 tablet (100 mg total) by mouth every morning.   No facility-administered encounter medications on file as of 08/25/2017.    ALLERGIES: No Known Allergies VACCINATION STATUS:  There is no immunization history on file for this patient.  Diabetes  He presents for his follow-up diabetic visit. He has type 2 diabetes mellitus. Onset time: He was diagnosed at approximate age of 29 years. His disease course has been worsening (Since his last visit to he started to register hyperglycemia in the range of 250-300 mg/dL. He was told to return with meter and logs showing blood glucose  readings 4 times a day.). There are no hypoglycemic associated  symptoms. Pertinent negatives for hypoglycemia include no confusion, headaches, pallor or seizures. Associated symptoms include polydipsia and polyuria. Pertinent negatives for diabetes include no chest pain, no fatigue, no polyphagia and no weakness. There are no hypoglycemic complications. Symptoms are worsening. Diabetic complications include impotence. Risk factors for coronary artery disease include diabetes mellitus, dyslipidemia, hypertension, male sex and sedentary lifestyle. Current diabetic treatment includes insulin injections and oral agent (dual therapy) (As well as metformin and Invokana.). He is compliant with treatment most of the time. His weight is decreasing steadily. He is following a generally healthy diet. He participates in exercise intermittently. His home blood glucose trend is increasing steadily. His breakfast blood glucose range is generally >200 mg/dl. His overall blood glucose range is >200 mg/dl. (He came with only fasting blood glucose readings averaging greater than 200 mg/dL in the last week. His most recent A1c was 8.9%.) An ACE inhibitor/angiotensin II receptor blocker is not being taken.  Hyperlipidemia  This is a chronic problem. The current episode started more than 1 year ago. The problem is uncontrolled. Exacerbating diseases include diabetes and obesity. Pertinent negatives include no chest pain, myalgias or shortness of breath. Current antihyperlipidemic treatment includes statins. Risk factors for coronary artery disease include diabetes mellitus, dyslipidemia, hypertension and a sedentary lifestyle.  Hypertension  This is a chronic problem. The current episode started more  than 1 year ago. Pertinent negatives include no chest pain, headaches, neck pain, palpitations or shortness of breath. Risk factors for coronary artery disease include dyslipidemia, diabetes mellitus, obesity and sedentary lifestyle.     Review of Systems  Constitutional: Negative for fatigue  and unexpected weight change.  HENT: Negative for dental problem, mouth sores and trouble swallowing.   Eyes: Negative for visual disturbance.  Respiratory: Negative for cough, choking, chest tightness, shortness of breath and wheezing.   Cardiovascular: Negative for chest pain, palpitations and leg swelling.  Gastrointestinal: Negative for abdominal distention, abdominal pain, constipation, diarrhea, nausea and vomiting.  Endocrine: Positive for polydipsia and polyuria. Negative for polyphagia.  Genitourinary: Positive for impotence. Negative for dysuria, flank pain, hematuria and urgency.  Musculoskeletal: Negative for back pain, gait problem, myalgias and neck pain.  Skin: Negative for pallor, rash and wound.  Neurological: Negative for seizures, syncope, weakness, numbness and headaches.  Psychiatric/Behavioral: Negative.  Negative for confusion and dysphoric mood.    Objective:    BP 127/68   Pulse 99   Ht 6' (1.829 m)   Wt 243 lb (110.2 kg)   BMI 32.96 kg/m   Wt Readings from Last 3 Encounters:  08/25/17 243 lb (110.2 kg)  07/29/17 227 lb (103 kg)  04/11/17 234 lb (106.1 kg)    Physical Exam  Constitutional: He is oriented to person, place, and time. He appears well-developed and well-nourished. He is cooperative. No distress.  HENT:  Head: Normocephalic and atraumatic.  Eyes: EOM are normal.  Neck: Normal range of motion. Neck supple. No tracheal deviation present. No thyromegaly present.  Cardiovascular: Normal rate, S1 normal, S2 normal and normal heart sounds.  Exam reveals no gallop.   No murmur heard. Pulses:      Dorsalis pedis pulses are 1+ on the right side, and 1+ on the left side.       Posterior tibial pulses are 1+ on the right side, and 1+ on the left side.  Pulmonary/Chest: Breath sounds normal. No respiratory distress. He has no wheezes.  Abdominal: Soft. Bowel sounds are normal. He exhibits no distension. There is no tenderness. There is no guarding and  no CVA tenderness.  Musculoskeletal: He exhibits no edema.       Right shoulder: He exhibits no swelling and no deformity.  Neurological: He is alert and oriented to person, place, and time. He has normal strength and normal reflexes. No cranial nerve deficit or sensory deficit. Gait normal.  Skin: Skin is warm and dry. No rash noted. No cyanosis. Nails show no clubbing.  Psychiatric: He has a normal mood and affect. His speech is normal and behavior is normal. Judgment and thought content normal. Cognition and memory are normal.   Recent Results (from the past 2160 hour(s))  Basic metabolic panel     Status: None   Collection Time: 07/03/17 12:00 AM  Result Value Ref Range   BUN 13 4 - 21   Creatinine 0.7 0.6 - 1.3  Lipid panel     Status: Abnormal   Collection Time: 07/03/17 12:00 AM  Result Value Ref Range   Triglycerides 212 (A) 40 - 160   Cholesterol 126 0 - 200   HDL 21 (A) 35 - 70   LDL Cholesterol 70   Hemoglobin A1c     Status: None   Collection Time: 07/03/17 12:00 AM  Result Value Ref Range   Hemoglobin A1C 8.9   TSH     Status: None  Collection Time: 07/03/17 12:00 AM  Result Value Ref Range   TSH 2.97 0.41 - 5.90    - His labs also show normal renal function.  Assessment & Plan:   1. Uncontrolled type 2 diabetes mellitus with complication, with long-term current use of insulin (HCC)  - Patient remains at a high risk for more acute and chronic complications of diabetes which include CAD, CVA, CKD, retinopathy, and neuropathy. These are all discussed in detail with the patient.  Patient recently came with  loss of control of diabetes with A1c rising to 8.9% from 7.8%. This is due to multiple social reasons that he had to encounter since last visit including the death of his mother and mother-in-law and illness of his father. This has distracted his routine treatment regimen. - He call clinic for hyperglycemia, was advised to initiate testing blood glucose 4 times a  day before this visit, however he only checked 1 time a day averaging still above 200 mg/dL.   Recent labs reviewed, showing normal renal function.  - I have re-counseled the patient on diet management and weight loss  by adopting a carbohydrate restricted / protein rich  Diet.  -  Suggestion is made for him to avoid simple carbohydrates  from his diet including Cakes, Sweet Desserts / Pastries, Ice Cream, Soda (diet and regular), Sweet Tea, Candies, Chips, Cookies, Store Bought Juices, Alcohol in Excess of  1-2 drinks a day, Artificial Sweeteners, and "Sugar-free" Products. This will help patient to have stable blood glucose profile and potentially avoid unintended weight gain.  - Patient is advised to stick to a routine mealtimes to eat 3 meals  a day and avoid unnecessary snacks ( to snack only to correct hypoglycemia).   - I have approached patient with the following individualized plan to manage diabetes and patient agrees.  - He may require intensive treatment with basal/bolus insulin to achieve control of diabetes to target. - I requested for him to start monitoring blood glucose 4 times a day-before meals and at bedtime and return in 1 week with his meter and logs. - In the meantime, I have advised him to continue Levemir 80 units daily at bedtime. -Patient is encouraged to call clinic for blood glucose levels less than 70 or above 200 mg /dl. -I will continue metformin 1000 mg by mouth twice a day, discontinue Invokana.   - I will continue Trulicity 1.5 mg weekly.  - Patient specific target  for A1c; LDL, HDL, Triglycerides, and  Waist Circumference were discussed in detail.  2) BP/HTN: controlled.  I will consider low-dose ace inhibitors next visit.  3) Lipids/HPL:  continue statins. 4)  Weight/Diet: CDE consult in progress, exercise, and carbohydrates information provided.  5) erectile dysfunction: He is benefiting from sildenafil therapy. I will refill sildenafil 50 mg by mouth  when necessary.  6) Chronic Care/Health Maintenance: -Patient is  on  medications and encouraged to continue to follow up with Ophthalmology, Podiatrist at least yearly or according to recommendations, and advised to  stay away from smoking. I have recommended yearly flu vaccine and pneumonia vaccination at least every 5 years; moderate intensity exercise for up to 150 minutes weekly; and  sleep for at least 7 hours a day.    - I advised patient to maintain close follow up with his PCP for primary care needs.    Follow up plan: Return in about 1 week (around 09/01/2017) for follow up with meter and logs- no labs.  Porfirio MylarGebre  Fransico Him, MD Phone: [phone]  Fax: [phone]   This note was partially dictated with voice recognition software. Similar sounding words can be transcribed inadequately or may not  be corrected upon review.  08/25/2017, 3:23 PM

## 2017-09-01 ENCOUNTER — Encounter: Payer: Self-pay | Admitting: "Endocrinology

## 2017-09-01 ENCOUNTER — Ambulatory Visit (INDEPENDENT_AMBULATORY_CARE_PROVIDER_SITE_OTHER): Payer: BLUE CROSS/BLUE SHIELD | Admitting: "Endocrinology

## 2017-09-01 VITALS — BP 121/77 | HR 91 | Ht 72.0 in | Wt 242.0 lb

## 2017-09-01 DIAGNOSIS — E118 Type 2 diabetes mellitus with unspecified complications: Secondary | ICD-10-CM

## 2017-09-01 DIAGNOSIS — Z6831 Body mass index (BMI) 31.0-31.9, adult: Secondary | ICD-10-CM | POA: Diagnosis not present

## 2017-09-01 DIAGNOSIS — E1165 Type 2 diabetes mellitus with hyperglycemia: Secondary | ICD-10-CM | POA: Diagnosis not present

## 2017-09-01 DIAGNOSIS — E782 Mixed hyperlipidemia: Secondary | ICD-10-CM | POA: Diagnosis not present

## 2017-09-01 DIAGNOSIS — E6609 Other obesity due to excess calories: Secondary | ICD-10-CM | POA: Diagnosis not present

## 2017-09-01 DIAGNOSIS — Z794 Long term (current) use of insulin: Secondary | ICD-10-CM

## 2017-09-01 DIAGNOSIS — I10 Essential (primary) hypertension: Secondary | ICD-10-CM | POA: Diagnosis not present

## 2017-09-01 DIAGNOSIS — IMO0002 Reserved for concepts with insufficient information to code with codable children: Secondary | ICD-10-CM

## 2017-09-01 NOTE — Progress Notes (Signed)
Subjective:    Patient ID: George White, male    DOB: 1961-08-16,    Past Medical History:  Diagnosis Date  . Diabetes mellitus, type II (HCC)   . Hyperlipidemia    Past Surgical History:  Procedure Laterality Date  . ROTATOR CUFF REPAIR     Social History   Socioeconomic History  . Marital status: Married    Spouse name: None  . Number of children: None  . Years of education: None  . Highest education level: None  Social Needs  . Financial resource strain: None  . Food insecurity - worry: None  . Food insecurity - inability: None  . Transportation needs - medical: None  . Transportation needs - non-medical: None  Occupational History  . None  Tobacco Use  . Smoking status: Never Smoker  . Smokeless tobacco: Never Used  Substance and Sexual Activity  . Alcohol use: No    Alcohol/week: 0.0 oz  . Drug use: No  . Sexual activity: None  Other Topics Concern  . None  Social History Narrative  . None   Outpatient Encounter Medications as of 09/01/2017  Medication Sig  . insulin lispro (HUMALOG) 100 UNIT/ML KiwkPen Inject 10 Units 3 (three) times daily before meals into the skin.  . BD PEN NEEDLE NANO U/F 32G X 4 MM MISC USE EVERY DAY AT BEDTIME  . Dulaglutide (TRULICITY) 1.5 MG/0.5ML SOPN Inject into the skin once a week.  . Insulin Detemir (LEVEMIR FLEXTOUCH) 100 UNIT/ML Pen Inject 80 Units into the skin at bedtime.  . metFORMIN (GLUCOPHAGE) 1000 MG tablet TAKE 1 TABLET BY MOUTH TWICE A DAY  . sildenafil (VIAGRA) 50 MG tablet Take 1 tablet (50 mg total) by mouth daily as needed for erectile dysfunction.  . simvastatin (ZOCOR) 10 MG tablet TAKE 1 TABLET BY MOUTH AT BEDTIME   No facility-administered encounter medications on file as of 09/01/2017.    ALLERGIES: No Known Allergies VACCINATION STATUS:  There is no immunization history on file for this patient.  Diabetes  He presents for his follow-up diabetic visit. He has type 2 diabetes mellitus. Onset time:  He was diagnosed at approximate age of 29 years. His disease course has been worsening (Since his last visit to he started to register hyperglycemia in the range of 250-300 mg/dL. He was told to return with meter and logs showing blood glucose  readings 4 times a day.). There are no hypoglycemic associated symptoms. Pertinent negatives for hypoglycemia include no confusion, headaches, pallor or seizures. Associated symptoms include polydipsia and polyuria. Pertinent negatives for diabetes include no chest pain, no fatigue, no polyphagia and no weakness. There are no hypoglycemic complications. Symptoms are worsening. Diabetic complications include impotence. Risk factors for coronary artery disease include diabetes mellitus, dyslipidemia, hypertension, male sex and sedentary lifestyle. Current diabetic treatment includes insulin injections and oral agent (dual therapy) (As well as metformin and Invokana.). He is compliant with treatment most of the time. His weight is stable. He is following a generally healthy diet. He participates in exercise intermittently. His home blood glucose trend is increasing steadily. His breakfast blood glucose range is generally >200 mg/dl. His lunch blood glucose range is generally 180-200 mg/dl. His dinner blood glucose range is generally 180-200 mg/dl. His bedtime blood glucose range is generally >200 mg/dl. His overall blood glucose range is >200 mg/dl. ( His most recent A1c was 8.9%.) An ACE inhibitor/angiotensin II receptor blocker is not being taken.  Hyperlipidemia  This is a chronic  problem. The current episode started more than 1 year ago. The problem is uncontrolled. Exacerbating diseases include diabetes and obesity. Pertinent negatives include no chest pain, myalgias or shortness of breath. Current antihyperlipidemic treatment includes statins. Risk factors for coronary artery disease include diabetes mellitus, dyslipidemia, hypertension and a sedentary lifestyle.   Hypertension  This is a chronic problem. The current episode started more than 1 year ago. Pertinent negatives include no chest pain, headaches, neck pain, palpitations or shortness of breath. Risk factors for coronary artery disease include dyslipidemia, diabetes mellitus, obesity and sedentary lifestyle.    Review of Systems  Constitutional: Negative for fatigue and unexpected weight change.  HENT: Negative for dental problem, mouth sores and trouble swallowing.   Eyes: Negative for visual disturbance.  Respiratory: Negative for cough, choking, chest tightness, shortness of breath and wheezing.   Cardiovascular: Negative for chest pain, palpitations and leg swelling.  Gastrointestinal: Negative for abdominal distention, abdominal pain, constipation, diarrhea, nausea and vomiting.  Endocrine: Positive for polydipsia and polyuria. Negative for polyphagia.  Genitourinary: Positive for impotence. Negative for dysuria, flank pain, hematuria and urgency.  Musculoskeletal: Negative for back pain, gait problem, myalgias and neck pain.  Skin: Negative for pallor, rash and wound.  Neurological: Negative for seizures, syncope, weakness, numbness and headaches.  Psychiatric/Behavioral: Negative.  Negative for confusion and dysphoric mood.    Objective:    BP 121/77 (BP Location: Right Arm, Patient Position: Sitting, Cuff Size: Normal)   Pulse 91   Ht 6' (1.829 m)   Wt 242 lb (109.8 kg)   BMI 32.82 kg/m   Wt Readings from Last 3 Encounters:  09/01/17 242 lb (109.8 kg)  08/25/17 243 lb (110.2 kg)  07/29/17 227 lb (103 kg)    Physical Exam  Constitutional: He is oriented to person, place, and time. He appears well-developed and well-nourished. He is cooperative. No distress.  HENT:  Head: Normocephalic and atraumatic.  Eyes: EOM are normal.  Neck: Normal range of motion. Neck supple. No tracheal deviation present. No thyromegaly present.  Cardiovascular: Normal rate, S1 normal, S2 normal  and normal heart sounds. Exam reveals no gallop.  No murmur heard. Pulses:      Dorsalis pedis pulses are 1+ on the right side, and 1+ on the left side.       Posterior tibial pulses are 1+ on the right side, and 1+ on the left side.  Pulmonary/Chest: Breath sounds normal. No respiratory distress. He has no wheezes.  Abdominal: Soft. Bowel sounds are normal. He exhibits no distension. There is no tenderness. There is no guarding and no CVA tenderness.  Musculoskeletal: He exhibits no edema.       Right shoulder: He exhibits no swelling and no deformity.  Neurological: He is alert and oriented to person, place, and time. He has normal strength and normal reflexes. No cranial nerve deficit or sensory deficit. Gait normal.  Skin: Skin is warm and dry. No rash noted. No cyanosis. Nails show no clubbing.  Psychiatric: He has a normal mood and affect. His speech is normal and behavior is normal. Judgment and thought content normal. Cognition and memory are normal.   Recent Results (from the past 2160 hour(s))  Basic metabolic panel     Status: None   Collection Time: 07/03/17 12:00 AM  Result Value Ref Range   BUN 13 4 - 21   Creatinine 0.7 0.6 - 1.3  Lipid panel     Status: Abnormal   Collection Time: 07/03/17 12:00 AM  Result Value Ref Range   Triglycerides 212 (A) 40 - 160   Cholesterol 126 0 - 200   HDL 21 (A) 35 - 70   LDL Cholesterol 70   Hemoglobin A1c     Status: None   Collection Time: 07/03/17 12:00 AM  Result Value Ref Range   Hemoglobin A1C 8.9   TSH     Status: None   Collection Time: 07/03/17 12:00 AM  Result Value Ref Range   TSH 2.97 0.41 - 5.90    - His labs also show normal renal function.  Assessment & Plan:   1. Uncontrolled type 2 diabetes mellitus with complication, with long-term current use of insulin (HCC)  - Patient remains at a high risk for more acute and chronic complications of diabetes which include CAD, CVA, CKD, retinopathy, and neuropathy. These  are all discussed in detail with the patient.  - He returns to clinic with persistent hyperglycemia, despite maximized basal insulin.  Recent labs reviewed, showing normal renal function.  - I have re-counseled the patient on diet management and weight loss  by adopting a carbohydrate restricted / protein rich  Diet.  -  Suggestion is made for him to avoid simple carbohydrates  from his diet including Cakes, Sweet Desserts / Pastries, Ice Cream, Soda (diet and regular), Sweet Tea, Candies, Chips, Cookies, Store Bought Juices, Alcohol in Excess of  1-2 drinks a day, Artificial Sweeteners, and "Sugar-free" Products. This will help patient to have stable blood glucose profile and potentially avoid unintended weight gain.  - Patient is advised to stick to a routine mealtimes to eat 3 meals  a day and avoid unnecessary snacks ( to snack only to correct hypoglycemia).   - I have approached patient with the following individualized plan to manage diabetes and patient agrees.  - Given his current persistent hyperglycemia,he will require intensive treatment with basal/bolus insulin to achieve control of diabetes to target. - I requested for him to continue monitoring blood glucose 4 times a day-before meals and at bedtime . -  I have advised him to continue Levemir 80 units daily at bedtime. - I discussed and initiated Humalog 10 units 3 times a day before meals for blood glucose readings pre-meal above 90 mg/dL (with additional correction for blood glucose readings above 150 mg/dL). - Samples of Humalog given from clinic. -Patient is encouraged to call clinic for blood glucose levels less than 70 or above 300 mg /dl. -I will continue metformin 1000 mg by mouth twice a day, discontinue Invokana.  - I will continue Trulicity 1.5 mg weekly.  - Patient specific target  for A1c; LDL, HDL, Triglycerides, and  Waist Circumference were discussed in detail.  2) BP/HTN: controlled.  I will consider low-dose  ace inhibitors next visit.  3) Lipids/HPL:  continue statins. 4)  Weight/Diet: CDE consult in progress, exercise, and carbohydrates information provided.  5) erectile dysfunction: He is benefiting from sildenafil therapy. I will refill sildenafil 50 mg by mouth when necessary.  6) Chronic Care/Health Maintenance: -Patient is  on  medications and encouraged to continue to follow up with Ophthalmology, Podiatrist at least yearly or according to recommendations, and advised to  stay away from smoking. I have recommended yearly flu vaccine and pneumonia vaccination at least every 5 years; moderate intensity exercise for up to 150 minutes weekly; and  sleep for at least 7 hours a day.    - I advised patient to maintain close follow up with his PCP for  primary care needs.   - Time spent with the patient: 25 min, of which >50% was spent in reviewing his sugar logs , discussing his hypo- and hyper-glycemic episodes, reviewing his current and  previous labs and insulin doses and developing a plan to avoid hypo- and hyper-glycemia.   Follow up plan: Return in about 7 weeks (around 10/20/2017) for meter, and logs.  Marquis Lunch, MD Phone: [phone]  Fax: [phone]   This note was partially dictated with voice recognition software. Similar sounding words can be transcribed inadequately or may not  be corrected upon review.  09/01/2017, 2:55 PM

## 2017-09-01 NOTE — Patient Instructions (Signed)

## 2017-09-06 ENCOUNTER — Other Ambulatory Visit: Payer: Self-pay | Admitting: "Endocrinology

## 2017-09-11 ENCOUNTER — Other Ambulatory Visit: Payer: Self-pay | Admitting: "Endocrinology

## 2017-09-14 ENCOUNTER — Other Ambulatory Visit: Payer: Self-pay | Admitting: "Endocrinology

## 2017-09-26 ENCOUNTER — Telehealth: Payer: Self-pay | Admitting: *Deleted

## 2017-09-26 NOTE — Telephone Encounter (Signed)
Routing to nurse Selena Batten

## 2017-09-26 NOTE — Telephone Encounter (Signed)
Patient called requesting more samples of humalog 100 unit. Please advise [phone]

## 2017-09-29 ENCOUNTER — Telehealth: Payer: Self-pay | Admitting: "Endocrinology

## 2017-09-29 NOTE — Telephone Encounter (Signed)
George White is calling stating that at his last visit with Dr. Fransico Him he was given samples of Humalog and now the samples have ran out and hes not due back to see Dr. Fransico Him until January, he is asking for a Rx to be sent to the pharmacy, please advise?

## 2017-09-30 NOTE — Telephone Encounter (Signed)
Pt states he will pick up samples

## 2017-10-18 ENCOUNTER — Other Ambulatory Visit: Payer: Self-pay | Admitting: "Endocrinology

## 2017-10-22 ENCOUNTER — Telehealth: Payer: Self-pay | Admitting: "Endocrinology

## 2017-10-22 MED ORDER — INSULIN PEN NEEDLE 32G X 4 MM MISC: 2 refills | Status: DC

## 2017-10-22 NOTE — Telephone Encounter (Signed)
George White is asking for a refill on Pen Needles that goes on the end of the Humalog Pens sent to [facility] on [address] [place], [place]

## 2017-10-24 LAB — BASIC METABOLIC PANEL
BUN: 11 (ref 4–21)
Creatinine: 0.7 (ref <=1.3)

## 2017-10-24 LAB — HEMOGLOBIN A1C: Hemoglobin A1C: 8.3

## 2017-10-29 ENCOUNTER — Ambulatory Visit (INDEPENDENT_AMBULATORY_CARE_PROVIDER_SITE_OTHER): Payer: BLUE CROSS/BLUE SHIELD | Admitting: "Endocrinology

## 2017-10-29 ENCOUNTER — Ambulatory Visit: Payer: BLUE CROSS/BLUE SHIELD | Admitting: "Endocrinology

## 2017-10-29 ENCOUNTER — Encounter: Payer: Self-pay | Admitting: "Endocrinology

## 2017-10-29 VITALS — BP 133/75 | HR 89 | Ht 72.0 in | Wt 246.0 lb

## 2017-10-29 DIAGNOSIS — E782 Mixed hyperlipidemia: Secondary | ICD-10-CM | POA: Diagnosis not present

## 2017-10-29 DIAGNOSIS — E118 Type 2 diabetes mellitus with unspecified complications: Secondary | ICD-10-CM | POA: Diagnosis not present

## 2017-10-29 DIAGNOSIS — Z794 Long term (current) use of insulin: Secondary | ICD-10-CM | POA: Diagnosis not present

## 2017-10-29 DIAGNOSIS — E1165 Type 2 diabetes mellitus with hyperglycemia: Secondary | ICD-10-CM | POA: Diagnosis not present

## 2017-10-29 DIAGNOSIS — Z6831 Body mass index (BMI) 31.0-31.9, adult: Secondary | ICD-10-CM

## 2017-10-29 DIAGNOSIS — I10 Essential (primary) hypertension: Secondary | ICD-10-CM

## 2017-10-29 DIAGNOSIS — E6609 Other obesity due to excess calories: Secondary | ICD-10-CM

## 2017-10-29 DIAGNOSIS — IMO0002 Reserved for concepts with insufficient information to code with codable children: Secondary | ICD-10-CM

## 2017-10-29 NOTE — Patient Instructions (Signed)

## 2017-10-29 NOTE — Progress Notes (Signed)
Subjective:    Patient ID: George White, male    DOB: 10/24/1961,    Past Medical History:  Diagnosis Date  . Diabetes mellitus, type II (HCC)   . Hyperlipidemia    Past Surgical History:  Procedure Laterality Date  . ROTATOR CUFF REPAIR     Social History   Socioeconomic History  . Marital status: Married    Spouse name: None  . Number of children: None  . Years of education: None  . Highest education level: None  Social Needs  . Financial resource strain: None  . Food insecurity - worry: None  . Food insecurity - inability: None  . Transportation needs - medical: None  . Transportation needs - non-medical: None  Occupational History  . None  Tobacco Use  . Smoking status: Never Smoker  . Smokeless tobacco: Never Used  Substance and Sexual Activity  . Alcohol use: No    Alcohol/week: 0.0 oz  . Drug use: No  . Sexual activity: None  Other Topics Concern  . None  Social History Narrative  . None   Outpatient Encounter Medications as of 10/29/2017  Medication Sig  . carvedilol (COREG) 3.125 MG tablet Take 3.125 mg by mouth 2 (two) times daily with a meal.  . lisinopril (PRINIVIL,ZESTRIL) 5 MG tablet Take 5 mg by mouth daily.  . Dulaglutide (TRULICITY) 1.5 MG/0.5ML SOPN Inject into the skin once a week.  . insulin lispro (HUMALOG) 100 UNIT/ML KiwkPen Inject 15-21 Units into the skin 3 (three) times daily before meals.  . Insulin Pen Needle (ULTICARE MICRO PEN NEEDLES) 32G X 4 MM MISC qid  . metFORMIN (GLUCOPHAGE) 1000 MG tablet TAKE 1 TABLET BY MOUTH TWICE A DAY  . sildenafil (VIAGRA) 50 MG tablet Take 1 tablet (50 mg total) by mouth daily as needed for erectile dysfunction.  . simvastatin (ZOCOR) 10 MG tablet TAKE 1 TABLET BY MOUTH AT BEDTIME  . TRESIBA FLEXTOUCH 100 UNIT/ML SOPN FlexTouch Pen INJECT 0.7 (70 UNITS) INTO THE SKIN AT BEDTIME  . [DISCONTINUED] Insulin Detemir (LEVEMIR FLEXTOUCH) 100 UNIT/ML Pen Inject 80 Units into the skin at bedtime.   No  facility-administered encounter medications on file as of 10/29/2017.    ALLERGIES: No Known Allergies VACCINATION STATUS:  There is no immunization history on file for this patient.  Diabetes  He presents for his follow-up diabetic visit. He has type 2 diabetes mellitus. Onset time: He was diagnosed at approximate age of 29 years. His disease course has been improving (Since his last visit to he started to register hyperglycemia in the range of 250-300 mg/dL. He was told to return with meter and logs showing blood glucose  readings 4 times a day.). There are no hypoglycemic associated symptoms. Pertinent negatives for hypoglycemia include no confusion, headaches, pallor or seizures. Pertinent negatives for diabetes include no chest pain, no fatigue, no polydipsia, no polyphagia, no polyuria and no weakness. There are no hypoglycemic complications. Symptoms are improving. Diabetic complications include impotence. Risk factors for coronary artery disease include diabetes mellitus, dyslipidemia, hypertension, male sex and sedentary lifestyle. Current diabetic treatment includes insulin injections and oral agent (dual therapy) (As well as metformin and Invokana.). He is compliant with treatment most of the time. His weight is increasing steadily. He is following a generally healthy diet. He participates in exercise intermittently. His home blood glucose trend is increasing steadily. His breakfast blood glucose range is generally 140-180 mg/dl. His lunch blood glucose range is generally 180-200 mg/dl. His dinner  blood glucose range is generally 180-200 mg/dl. His bedtime blood glucose range is generally 180-200 mg/dl. His overall blood glucose range is 180-200 mg/dl. ( His most recent A1c  8.3%, slowly improving from 8.9%. ) An ACE inhibitor/angiotensin II receptor blocker is not being taken.  Hyperlipidemia  This is a chronic problem. The current episode started more than 1 year ago. The problem is  uncontrolled. Exacerbating diseases include diabetes and obesity. Pertinent negatives include no chest pain, myalgias or shortness of breath. Current antihyperlipidemic treatment includes statins. Risk factors for coronary artery disease include diabetes mellitus, dyslipidemia, hypertension and a sedentary lifestyle.  Hypertension  This is a chronic problem. The current episode started more than 1 year ago. Pertinent negatives include no chest pain, headaches, neck pain, palpitations or shortness of breath. Risk factors for coronary artery disease include dyslipidemia, diabetes mellitus, obesity and sedentary lifestyle.    Review of Systems  Constitutional: Negative for fatigue and unexpected weight change.  HENT: Negative for dental problem, mouth sores and trouble swallowing.   Eyes: Negative for visual disturbance.  Respiratory: Negative for cough, choking, chest tightness, shortness of breath and wheezing.   Cardiovascular: Negative for chest pain, palpitations and leg swelling.  Gastrointestinal: Negative for abdominal distention, abdominal pain, constipation, diarrhea, nausea and vomiting.  Endocrine: Negative for polydipsia, polyphagia and polyuria.  Genitourinary: Positive for impotence. Negative for dysuria, flank pain, hematuria and urgency.  Musculoskeletal: Negative for back pain, gait problem, myalgias and neck pain.  Skin: Negative for pallor, rash and wound.  Neurological: Negative for seizures, syncope, weakness, numbness and headaches.  Psychiatric/Behavioral: Negative.  Negative for confusion and dysphoric mood.    Objective:    BP 133/75   Pulse 89   Ht 6' (1.829 m)   Wt 246 lb (111.6 kg)   BMI 33.36 kg/m   Wt Readings from Last 3 Encounters:  10/29/17 246 lb (111.6 kg)  09/01/17 242 lb (109.8 kg)  08/25/17 243 lb (110.2 kg)    Physical Exam  Constitutional: He is oriented to person, place, and time. He appears well-developed and well-nourished. He is cooperative.  No distress.  HENT:  Head: Normocephalic and atraumatic.  Eyes: EOM are normal.  Neck: Normal range of motion. Neck supple. No tracheal deviation present. No thyromegaly present.  Cardiovascular: Normal rate, S1 normal, S2 normal and normal heart sounds. Exam reveals no gallop.  No murmur heard. Pulses:      Dorsalis pedis pulses are 1+ on the right side, and 1+ on the left side.       Posterior tibial pulses are 1+ on the right side, and 1+ on the left side.  Pulmonary/Chest: Breath sounds normal. No respiratory distress. He has no wheezes.  Abdominal: Soft. Bowel sounds are normal. He exhibits no distension. There is no tenderness. There is no guarding and no CVA tenderness.  Musculoskeletal: He exhibits no edema.       Right shoulder: He exhibits no swelling and no deformity.  Neurological: He is alert and oriented to person, place, and time. He has normal strength and normal reflexes. No cranial nerve deficit or sensory deficit. Gait normal.  Skin: Skin is warm and dry. No rash noted. No cyanosis. Nails show no clubbing.  Psychiatric: He has a normal mood and affect. His speech is normal and behavior is normal. Judgment and thought content normal. Cognition and memory are normal.   Recent Results (from the past 2160 hour(s))  Basic metabolic panel     Status: None  Collection Time: 10/24/17 12:00 AM  Result Value Ref Range   BUN 11 4 - 21   Creatinine 0.7 0.6 - 1.3  Hemoglobin A1c     Status: None   Collection Time: 10/24/17 12:00 AM  Result Value Ref Range   Hemoglobin A1C 8.3     - His labs also show normal renal function.  Assessment & Plan:   1. Uncontrolled type 2 diabetes mellitus with complication, with long-term current use of insulin (HCC)  - Patient remains at a high risk for more acute and chronic complications of diabetes which include CAD, CVA, CKD, retinopathy, and neuropathy. These are all discussed in detail with the patient.  - He returns to clinic with   improving blood glucose profile, still above target postprandial blood glucose readings.   Recent labs reviewed, showing normal renal function.  - I have re-counseled the patient on diet management and weight loss  by adopting a carbohydrate restricted / protein rich  Diet.  -  Suggestion is made for him to avoid simple carbohydrates  from his diet including Cakes, Sweet Desserts / Pastries, Ice Cream, Soda (diet and regular), Sweet Tea, Candies, Chips, Cookies, Store Bought Juices, Alcohol in Excess of  1-2 drinks a day, Artificial Sweeteners, and "Sugar-free" Products. This will help patient to have stable blood glucose profile and potentially avoid unintended weight gain.   - Patient is advised to stick to a routine mealtimes to eat 3 meals  a day and avoid unnecessary snacks ( to snack only to correct hypoglycemia).   - I have approached patient with the following individualized plan to manage diabetes and patient agrees.  - Given his current persistent hyperglycemia, he will  continue to require intensive treatment with basal/bolus insulin to achieve control of diabetes to target. - I requested for him to continue monitoring blood glucose 4 times a day-before meals and at bedtime . -  I have advised him to continue  Tresiba  80 units daily at bedtime, increase Humalog to 15 units 3 times a day before meals for blood glucose readings pre-meal above 90 mg/dL (with additional correction for blood glucose readings above 150 mg/dL). - Samples of Humalog given from clinic. -Patient is encouraged to call clinic for blood glucose levels less than 70 or above 300 mg /dl. -I will continue metformin 1000 mg by mouth twice a day. - I will continue Trulicity 1.5 mg weekly.  - Patient specific target  for A1c; LDL, HDL, Triglycerides, and  Waist Circumference were discussed in detail.  2) BP/HTN: Controlled. He was initiated on 5 mg of  lisinopril and carvedilol by his cardiologist. 3) Lipids/HPL:   continue statins. 4)  Weight/Diet: CDE consult in progress, exercise, and carbohydrates information provided.  5) erectile dysfunction: He is benefiting from sildenafil therapy. I advised him to use as needed. 6) Chronic Care/Health Maintenance: -Patient is  on  medications and encouraged to continue to follow up with Ophthalmology, Podiatrist at least yearly or according to recommendations, and advised to  stay away from smoking. I have recommended yearly flu vaccine and pneumonia vaccination at least every 5 years; moderate intensity exercise for up to 150 minutes weekly; and  sleep for at least 7 hours a day.   - I advised patient to maintain close follow up with his PCP for primary care needs.   - Time spent with the patient: 25 min, of which >50% was spent in reviewing his blood glucose logs , discussing his hypo- and  hyper-glycemic episodes, reviewing his current and  previous labs and insulin doses and developing a plan to avoid hypo- and hyper-glycemia. Please refer to Patient Instructions for Blood Glucose Monitoring and Insulin/Medications Dosing Guide"  in media tab for additional information.   Follow up plan: Return in about 3 months (around 01/27/2018) for meter, and logs, follow up with pre-visit labs, meter, and logs.  Marquis Lunch, MD Phone: [phone]  Fax: [phone]   This note was partially dictated with voice recognition software. Similar sounding words can be transcribed inadequately or may not  be corrected upon review.  10/29/2017, 3:53 PM

## 2017-12-09 ENCOUNTER — Other Ambulatory Visit: Payer: Self-pay

## 2017-12-09 MED ORDER — INSULIN LISPRO 100 UNIT/ML (KWIKPEN): 15.0000 [IU] | PEN_INJECTOR | Freq: Three times a day (TID) | SUBCUTANEOUS | 2 refills | Status: DC

## 2017-12-10 ENCOUNTER — Other Ambulatory Visit: Payer: Self-pay | Admitting: "Endocrinology

## 2017-12-11 ENCOUNTER — Other Ambulatory Visit: Payer: Self-pay

## 2017-12-11 MED ORDER — INSULIN GLARGINE 100 UNIT/ML SOLOSTAR PEN: 70.0000 [IU] | PEN_INJECTOR | Freq: Every day | SUBCUTANEOUS | 2 refills | Status: DC

## 2018-01-14 ENCOUNTER — Other Ambulatory Visit: Payer: Self-pay

## 2018-01-14 MED ORDER — INSULIN GLARGINE 100 UNIT/ML SOLOSTAR PEN: 70.0000 [IU] | PEN_INJECTOR | Freq: Every day | SUBCUTANEOUS | 2 refills | Status: DC

## 2018-01-17 ENCOUNTER — Other Ambulatory Visit: Payer: Self-pay | Admitting: "Endocrinology

## 2018-01-21 LAB — HEMOGLOBIN A1C: Hemoglobin A1C: 7.7

## 2018-01-21 LAB — BASIC METABOLIC PANEL WITH GFR
BUN: 8 (ref 4–21)
Creatinine: 0.7 (ref <=1.3)

## 2018-01-27 ENCOUNTER — Ambulatory Visit (INDEPENDENT_AMBULATORY_CARE_PROVIDER_SITE_OTHER): Payer: BLUE CROSS/BLUE SHIELD | Admitting: "Endocrinology

## 2018-01-27 ENCOUNTER — Encounter: Payer: Self-pay | Admitting: "Endocrinology

## 2018-01-27 VITALS — BP 125/74 | HR 89 | Ht 72.0 in | Wt 249.0 lb

## 2018-01-27 DIAGNOSIS — E118 Type 2 diabetes mellitus with unspecified complications: Secondary | ICD-10-CM | POA: Diagnosis not present

## 2018-01-27 DIAGNOSIS — Z794 Long term (current) use of insulin: Secondary | ICD-10-CM | POA: Diagnosis not present

## 2018-01-27 DIAGNOSIS — E6609 Other obesity due to excess calories: Secondary | ICD-10-CM | POA: Diagnosis not present

## 2018-01-27 DIAGNOSIS — IMO0002 Reserved for concepts with insufficient information to code with codable children: Secondary | ICD-10-CM

## 2018-01-27 DIAGNOSIS — Z6831 Body mass index (BMI) 31.0-31.9, adult: Secondary | ICD-10-CM

## 2018-01-27 DIAGNOSIS — I10 Essential (primary) hypertension: Secondary | ICD-10-CM

## 2018-01-27 DIAGNOSIS — E1165 Type 2 diabetes mellitus with hyperglycemia: Secondary | ICD-10-CM

## 2018-01-27 DIAGNOSIS — E782 Mixed hyperlipidemia: Secondary | ICD-10-CM

## 2018-01-27 MED ORDER — INSULIN DEGLUDEC 200 UNIT/ML ~~LOC~~ SOPN: 80.0000 [IU] | PEN_INJECTOR | Freq: Every day | SUBCUTANEOUS | 1 refills | Status: DC

## 2018-01-27 MED ORDER — METFORMIN HCL 1000 MG PO TABS: 1000.0000 mg | ORAL_TABLET | Freq: Two times a day (BID) | ORAL | 0 refills | Status: DC

## 2018-01-27 MED ORDER — FREESTYLE LIBRE 14 DAY SENSOR MISC: 1.0000 | 2 refills | Status: DC

## 2018-01-27 MED ORDER — SIMVASTATIN 10 MG PO TABS: 10.0000 mg | ORAL_TABLET | Freq: Every day | ORAL | 1 refills | Status: DC

## 2018-01-27 MED ORDER — INSULIN LISPRO 100 UNIT/ML (KWIKPEN): 18.0000 [IU] | PEN_INJECTOR | Freq: Three times a day (TID) | SUBCUTANEOUS | 1 refills | Status: DC

## 2018-01-27 MED ORDER — EXENATIDE ER 2 MG/0.85ML ~~LOC~~ AUIJ: 2.0000 mg | AUTO-INJECTOR | SUBCUTANEOUS | 1 refills | Status: DC

## 2018-01-27 MED ORDER — SILDENAFIL CITRATE 50 MG PO TABS: 50.0000 mg | ORAL_TABLET | Freq: Every day | ORAL | 1 refills | Status: DC | PRN

## 2018-01-27 MED ORDER — FREESTYLE LIBRE 14 DAY READER DEVI: 1.0000 | Freq: Once | 0 refills | Status: AC

## 2018-01-27 NOTE — Progress Notes (Signed)
Subjective:    Patient ID: George White, male    DOB: [birth year],    Past Medical History:  Diagnosis Date  . Diabetes mellitus, type II (HCC)   . Hyperlipidemia    Past Surgical History:  Procedure Laterality Date  . ROTATOR CUFF REPAIR     Social History   Socioeconomic History  . Marital status: Married    Spouse name: Not on file  . Number of children: Not on file  . Years of education: Not on file  . Highest education level: Not on file  Occupational History  . Not on file  Social Needs  . Financial resource strain: Not on file  . Food insecurity:    Worry: Not on file    Inability: Not on file  . Transportation needs:    Medical: Not on file    Non-medical: Not on file  Tobacco Use  . Smoking status: Never Smoker  . Smokeless tobacco: Never Used  Substance and Sexual Activity  . Alcohol use: No    Alcohol/week: 0.0 oz  . Drug use: No  . Sexual activity: Not on file  Lifestyle  . Physical activity:    Days per week: Not on file    Minutes per session: Not on file  . Stress: Not on file  Relationships  . Social connections:    Talks on phone: Not on file    Gets together: Not on file    Attends religious service: Not on file    Active member of club or organization: Not on file    Attends meetings of clubs or organizations: Not on file    Relationship status: Not on file  Other Topics Concern  . Not on file  Social History Narrative  . Not on file   Outpatient Encounter Medications as of 01/27/2018  Medication Sig  . carvedilol (COREG) 3.125 MG tablet Take 3.125 mg by mouth 2 (two) times daily with a meal.  . Exenatide ER (BYDUREON) 2 MG PEN Inject into the skin once a week.  . insulin lispro (HUMALOG) 100 UNIT/ML KiwkPen Inject 0.18-0.24 mLs (18-24 Units total) into the skin 3 (three) times daily before meals.  Marland Kitchen lisinopril (PRINIVIL,ZESTRIL) 5 MG tablet Take 5 mg by mouth daily.  . metFORMIN (GLUCOPHAGE) 1000 MG tablet Take 1 tablet (1,000 mg  total) by mouth 2 (two) times daily.  . simvastatin (ZOCOR) 10 MG tablet Take 1 tablet (10 mg total) by mouth at bedtime.  . [DISCONTINUED] Insulin Glargine (LANTUS) 100 UNIT/ML Solostar Pen Inject 70 Units into the skin at bedtime.  . [DISCONTINUED] insulin lispro (HUMALOG) 100 UNIT/ML KiwkPen Inject 0.15-0.21 mLs (15-21 Units total) into the skin 3 (three) times daily before meals.  . [DISCONTINUED] metFORMIN (GLUCOPHAGE) 1000 MG tablet TAKE 1 TABLET BY MOUTH TWICE A DAY  . [DISCONTINUED] simvastatin (ZOCOR) 10 MG tablet TAKE 1 TABLET BY MOUTH AT BEDTIME  . Continuous Blood Gluc Receiver (FREESTYLE LIBRE 14 DAY READER) DEVI 1 each by Does not apply route once for 1 dose.  . Continuous Blood Gluc Sensor (FREESTYLE LIBRE 14 DAY SENSOR) MISC Inject 1 each into the skin every 14 (fourteen) days. Use as directed.  . Exenatide ER (BYDUREON BCISE) 2 MG/0.85ML AUIJ Inject 2 mg into the skin once a week.  . Insulin Degludec (TRESIBA FLEXTOUCH) 200 UNIT/ML SOPN Inject 80 Units into the skin at bedtime.  . Insulin Pen Needle (ULTICARE MICRO PEN NEEDLES) 32G X 4 MM MISC qid  . sildenafil (  VIAGRA) 50 MG tablet Take 1 tablet (50 mg total) by mouth daily as needed for erectile dysfunction.  . sildenafil (VIAGRA) 50 MG tablet Take 1 tablet (50 mg total) by mouth daily as needed for erectile dysfunction.  . [DISCONTINUED] Dulaglutide (TRULICITY) 1.5 MG/0.5ML SOPN Inject into the skin once a week.  . [DISCONTINUED] TRESIBA FLEXTOUCH 100 UNIT/ML SOPN FlexTouch Pen INJECT 0.7ML (70 UNITS) INTO THE SKIN AT BEDTIME   No facility-administered encounter medications on file as of 01/27/2018.    ALLERGIES: No Known Allergies VACCINATION STATUS:  There is no immunization history on file for this patient.  Diabetes  He presents for his follow-up diabetic visit. He has type 2 diabetes mellitus. Onset time: He was diagnosed at approximate age of 29 years. His disease course has been improving (Since his last visit to he  started to register hyperglycemia in the range of 250-300 mg/dL. He was told to return with meter and logs showing blood glucose  readings 4 times a day.). There are no hypoglycemic associated symptoms. Pertinent negatives for hypoglycemia include no confusion, headaches, pallor or seizures. Pertinent negatives for diabetes include no chest pain, no fatigue, no polydipsia, no polyphagia, no polyuria and no weakness. There are no hypoglycemic complications. Symptoms are improving. Diabetic complications include impotence. Risk factors for coronary artery disease include diabetes mellitus, dyslipidemia, hypertension, male sex and sedentary lifestyle. Current diabetic treatment includes insulin injections and oral agent (dual therapy) (As well as metformin and Invokana.). He is compliant with treatment most of the time. His weight is increasing steadily. He is following a generally unhealthy diet. He participates in exercise intermittently. His home blood glucose trend is increasing steadily. His breakfast blood glucose range is generally 180-200 mg/dl. His lunch blood glucose range is generally 180-200 mg/dl. His dinner blood glucose range is generally 180-200 mg/dl. His bedtime blood glucose range is generally 180-200 mg/dl. His overall blood glucose range is 180-200 mg/dl. An ACE inhibitor/angiotensin II receptor blocker is not being taken.  Hyperlipidemia  This is a chronic problem. The current episode started more than 1 year ago. The problem is uncontrolled. Exacerbating diseases include diabetes and obesity. Pertinent negatives include no chest pain, myalgias or shortness of breath. Current antihyperlipidemic treatment includes statins. Risk factors for coronary artery disease include diabetes mellitus, dyslipidemia, hypertension, a sedentary lifestyle, obesity and male sex.  Hypertension  This is a chronic problem. The current episode started more than 1 year ago. Pertinent negatives include no chest pain,  headaches, neck pain, palpitations or shortness of breath. Risk factors for coronary artery disease include dyslipidemia, diabetes mellitus, obesity and sedentary lifestyle.    Review of Systems  Constitutional: Negative for fatigue and unexpected weight change.  HENT: Negative for dental problem, mouth sores and trouble swallowing.   Eyes: Negative for visual disturbance.  Respiratory: Negative for cough, choking, chest tightness, shortness of breath and wheezing.   Cardiovascular: Negative for chest pain, palpitations and leg swelling.  Gastrointestinal: Negative for abdominal distention, abdominal pain, constipation, diarrhea, nausea and vomiting.  Endocrine: Negative for polydipsia, polyphagia and polyuria.  Genitourinary: Positive for impotence. Negative for dysuria, flank pain, hematuria and urgency.  Musculoskeletal: Negative for back pain, gait problem, myalgias and neck pain.  Skin: Negative for pallor, rash and wound.  Neurological: Negative for seizures, syncope, weakness, numbness and headaches.  Psychiatric/Behavioral: Negative.  Negative for confusion and dysphoric mood.    Objective:    BP 125/74   Pulse 89   Ht 6' (1.829 m)  Wt 249 lb (112.9 kg)   BMI 33.77 kg/m   Wt Readings from Last 3 Encounters:  01/27/18 249 lb (112.9 kg)  10/29/17 246 lb (111.6 kg)  09/01/17 242 lb (109.8 kg)    Physical Exam  Constitutional: He is oriented to person, place, and time. He appears well-developed. He is cooperative. No distress.  HENT:  Head: Normocephalic and atraumatic.  Eyes: EOM are normal.  Neck: Normal range of motion. Neck supple. No tracheal deviation present. No thyromegaly present.  Cardiovascular: Normal rate, S1 normal and S2 normal. Exam reveals no gallop.  No murmur heard. Pulses:      Dorsalis pedis pulses are 1+ on the right side, and 1+ on the left side.       Posterior tibial pulses are 1+ on the right side, and 1+ on the left side.  Pulmonary/Chest:  Effort normal. No respiratory distress. He has no wheezes.  Abdominal: He exhibits no distension. There is no tenderness. There is no guarding and no CVA tenderness.  Musculoskeletal: He exhibits no edema.       Right shoulder: He exhibits no swelling and no deformity.  Neurological: He is alert and oriented to person, place, and time. He has normal strength and normal reflexes. No cranial nerve deficit or sensory deficit. Gait normal.  Skin: Skin is warm and dry. No rash noted. No cyanosis. Nails show no clubbing.  Psychiatric: He has a normal mood and affect. His speech is normal. Judgment normal. Cognition and memory are normal.   Recent Results (from the past 2160 hour(s))  Basic metabolic panel     Status: None   Collection Time: 01/21/18 12:00 AM  Result Value Ref Range   BUN 8 4 - 21   Creatinine 0.7 0.6 - 1.3  Hemoglobin A1c     Status: None   Collection Time: 01/21/18 12:00 AM  Result Value Ref Range   Hemoglobin A1C 7.7    Lipid Panel     Component Value Date/Time   CHOL 126 07/03/2017   TRIG 212 (A) 07/03/2017   HDL 21 (A) 07/03/2017   LDLCALC 70 07/03/2017    Assessment & Plan:   1. Uncontrolled type 2 diabetes mellitus with complication, with long-term current use of insulin (HCC)  -He came with improved A1c of 7.7%.  Patient remains at a high risk for more acute and chronic complications of diabetes which include CAD, CVA, CKD, retinopathy, and neuropathy. These are all discussed in detail with the patient.   Recent labs reviewed, showing normal renal function.  - I have re-counseled the patient on diet management and weight loss  by adopting a carbohydrate restricted / protein rich  Diet.  -  Suggestion is made for him to avoid simple carbohydrates  from his diet including Cakes, Sweet Desserts / Pastries, Ice Cream, Soda (diet and regular), Sweet Tea, Candies, Chips, Cookies, Store Bought Juices, Alcohol in Excess of  1-2 drinks a day, Artificial Sweeteners, and  "Sugar-free" Products. This will help patient to have stable blood glucose profile and potentially avoid unintended weight gain.  - Patient is advised to stick to a routine mealtimes to eat 3 meals  a day and avoid unnecessary snacks ( to snack only to correct hypoglycemia).   - I have approached patient with the following individualized plan to manage diabetes and patient agrees.  -  he will  continue to require intensive treatment with basal/bolus insulin to achieve control of diabetes to target. - I advised him  to continue monitoring blood glucose 4 times a day-before meals and at bedtime.  I discussed and initiated the [facility] device for him.   -I would continue Tresiba 80 units nightly, increase Humalog to 18 units 3 times a day before meals for blood glucose readings pre-meal above 90 mg/dL (with additional correction for blood glucose readings above 150 mg/dL).  -Patient is encouraged to call clinic for blood glucose levels less than 70 or above 300 mg /dl. -I will continue metformin 1000 mg by mouth twice a day. - I will continue Bydureon 2 mg subcutaneously weekly.    - Patient specific target  for A1c; LDL, HDL, Triglycerides, and  Waist Circumference were discussed in detail.  2) BP/HTN: Controlled. He was initiated on 5 mg of  lisinopril and carvedilol by his cardiologist. 3) Lipids/HPL:  continue statins. 4)  Weight/Diet: CDE consult in progress, exercise, and carbohydrates information provided.  5) erectile dysfunction: He is benefiting from Viagra therapy.  I refilled this prescription per his request.    6) Chronic Care/Health Maintenance: -Patient is  on  medications and encouraged to continue to follow up with Ophthalmology, Podiatrist at least yearly or according to recommendations, and advised to  stay away from smoking. I have recommended yearly flu vaccine and pneumonia vaccination at least every 5 years; moderate intensity exercise for up to 150 minutes weekly;  and  sleep for at least 7 hours a day.   - I advised patient to maintain close follow up with his PCP for primary care needs.  - Time spent with the patient: 25 min, of which >50% was spent in reviewing his blood glucose logs , discussing his hypo- and hyper-glycemic episodes, reviewing his current and  previous labs and insulin doses and developing a plan to avoid hypo- and hyper-glycemia. Please refer to Patient Instructions for Blood Glucose Monitoring and Insulin/Medications Dosing Guide"  in media tab for additional information. George White participated in the discussions, expressed understanding, and voiced agreement with the above plans.  All questions were answered to his satisfaction. he is encouraged to contact clinic should he have any questions or concerns prior to his return visit.  Follow up plan: Return in about 3 months (around 04/28/2018) for follow up with pre-visit labs, meter, and logs.  Marquis Lunch, MD Phone: [phone]  Fax: [phone]   This note was partially dictated with voice recognition software. Similar sounding words can be transcribed inadequately or may not  be corrected upon review.  01/27/2018, 4:59 PM

## 2018-01-27 NOTE — Patient Instructions (Signed)

## 2018-02-12 ENCOUNTER — Other Ambulatory Visit: Payer: Self-pay

## 2018-02-12 MED ORDER — EXENATIDE ER 2 MG ~~LOC~~ PEN: 2.0000 mg | PEN_INJECTOR | SUBCUTANEOUS | 0 refills | Status: DC

## 2018-02-15 ENCOUNTER — Other Ambulatory Visit: Payer: Self-pay | Admitting: "Endocrinology

## 2018-02-20 ENCOUNTER — Other Ambulatory Visit: Payer: Self-pay

## 2018-02-20 MED ORDER — INSULIN LISPRO 100 UNIT/ML (KWIKPEN): 18.0000 [IU] | PEN_INJECTOR | Freq: Three times a day (TID) | SUBCUTANEOUS | 0 refills | Status: DC

## 2018-03-08 ENCOUNTER — Other Ambulatory Visit: Payer: Self-pay | Admitting: "Endocrinology

## 2018-04-25 LAB — BASIC METABOLIC PANEL
BUN: 10 (ref 4–21)
Creatinine: 0.9 (ref 0.6–1.3)

## 2018-04-25 LAB — HEMOGLOBIN A1C: Hemoglobin A1C: 7.3

## 2018-04-28 ENCOUNTER — Encounter: Payer: Self-pay | Admitting: "Endocrinology

## 2018-04-28 ENCOUNTER — Ambulatory Visit (INDEPENDENT_AMBULATORY_CARE_PROVIDER_SITE_OTHER): Payer: BLUE CROSS/BLUE SHIELD | Admitting: "Endocrinology

## 2018-04-28 VITALS — BP 118/76 | HR 96 | Ht 72.0 in | Wt 242.0 lb

## 2018-04-28 DIAGNOSIS — Z794 Long term (current) use of insulin: Secondary | ICD-10-CM

## 2018-04-28 DIAGNOSIS — IMO0002 Reserved for concepts with insufficient information to code with codable children: Secondary | ICD-10-CM

## 2018-04-28 DIAGNOSIS — I10 Essential (primary) hypertension: Secondary | ICD-10-CM | POA: Diagnosis not present

## 2018-04-28 DIAGNOSIS — E118 Type 2 diabetes mellitus with unspecified complications: Secondary | ICD-10-CM | POA: Diagnosis not present

## 2018-04-28 DIAGNOSIS — E6609 Other obesity due to excess calories: Secondary | ICD-10-CM

## 2018-04-28 DIAGNOSIS — E1165 Type 2 diabetes mellitus with hyperglycemia: Secondary | ICD-10-CM

## 2018-04-28 DIAGNOSIS — Z6831 Body mass index (BMI) 31.0-31.9, adult: Secondary | ICD-10-CM | POA: Diagnosis not present

## 2018-04-28 DIAGNOSIS — E782 Mixed hyperlipidemia: Secondary | ICD-10-CM | POA: Diagnosis not present

## 2018-04-28 NOTE — Patient Instructions (Signed)

## 2018-04-28 NOTE — Progress Notes (Signed)
Subjective:    Patient ID: George White, male    DOB: 1961/07/09,    Past Medical History:  Diagnosis Date  . Diabetes mellitus, type II (HCC)   . Hyperlipidemia    Past Surgical History:  Procedure Laterality Date  . ROTATOR CUFF REPAIR     Social History   Socioeconomic History  . Marital status: Married    Spouse name: Not on file  . Number of children: Not on file  . Years of education: Not on file  . Highest education level: Not on file  Occupational History  . Not on file  Social Needs  . Financial resource strain: Not on file  . Food insecurity:    Worry: Not on file    Inability: Not on file  . Transportation needs:    Medical: Not on file    Non-medical: Not on file  Tobacco Use  . Smoking status: Never Smoker  . Smokeless tobacco: Never Used  Substance and Sexual Activity  . Alcohol use: No    Alcohol/week: 0.0 oz  . Drug use: No  . Sexual activity: Not on file  Lifestyle  . Physical activity:    Days per week: Not on file    Minutes per session: Not on file  . Stress: Not on file  Relationships  . Social connections:    Talks on phone: Not on file    Gets together: Not on file    Attends religious service: Not on file    Active member of club or organization: Not on file    Attends meetings of clubs or organizations: Not on file    Relationship status: Not on file  Other Topics Concern  . Not on file  Social History Narrative  . Not on file   Outpatient Encounter Medications as of 04/28/2018  Medication Sig  . carvedilol (COREG) 3.125 MG tablet Take 3.125 mg by mouth 2 (two) times daily with a meal.  . Continuous Blood Gluc Sensor (FREESTYLE LIBRE 14 DAY SENSOR) MISC Inject 1 each into the skin every 14 (fourteen) days. Use as directed.  . Exenatide ER (BYDUREON) 2 MG PEN Inject 2 mg into the skin once a week.  . Insulin Degludec (TRESIBA FLEXTOUCH) 200 UNIT/ML SOPN Inject 80 Units into the skin at bedtime.  . insulin lispro (HUMALOG) 100  UNIT/ML KiwkPen Inject 0.18-0.24 mLs (18-24 Units total) into the skin 3 (three) times daily before meals.  . Insulin Pen Needle (ULTICARE MICRO PEN NEEDLES) 32G X 4 MM MISC qid  . lisinopril (PRINIVIL,ZESTRIL) 5 MG tablet Take 5 mg by mouth daily.  . metFORMIN (GLUCOPHAGE) 1000 MG tablet Take 1 tablet (1,000 mg total) by mouth 2 (two) times daily.  . sildenafil (VIAGRA) 50 MG tablet Take 1 tablet (50 mg total) by mouth daily as needed for erectile dysfunction.  . simvastatin (ZOCOR) 10 MG tablet Take 1 tablet (10 mg total) by mouth at bedtime.  . [DISCONTINUED] Exenatide ER (BYDUREON BCISE) 2 MG/0.85ML AUIJ Inject 2 mg into the skin once a week.  . [DISCONTINUED] insulin lispro (HUMALOG KWIKPEN) 100 UNIT/ML KiwkPen Inject 0.18-0.24 mLs (18-24 Units total) into the skin 3 (three) times daily.  . [DISCONTINUED] sildenafil (VIAGRA) 50 MG tablet Take 1 tablet (50 mg total) by mouth daily as needed for erectile dysfunction.  . [DISCONTINUED] simvastatin (ZOCOR) 10 MG tablet TAKE 1 TABLET BY MOUTH AT BEDTIME   No facility-administered encounter medications on file as of 04/28/2018.    ALLERGIES:  No Known Allergies VACCINATION STATUS:  There is no immunization history on file for this patient.  Diabetes  He presents for his follow-up diabetic visit. He has type 2 diabetes mellitus. Onset time: He was diagnosed at approximate age of 29 years. His disease course has been improving (Since his last visit to he started to register hyperglycemia in the range of 250-300 mg/dL. He was told to return with meter and logs showing blood glucose  readings 4 times a day.). There are no hypoglycemic associated symptoms. Pertinent negatives for hypoglycemia include no confusion, headaches, pallor or seizures. Pertinent negatives for diabetes include no chest pain, no fatigue, no polydipsia, no polyphagia, no polyuria and no weakness. There are no hypoglycemic complications. Symptoms are improving. Diabetic complications  include impotence. Risk factors for coronary artery disease include diabetes mellitus, dyslipidemia, hypertension, male sex and sedentary lifestyle. Current diabetic treatment includes insulin injections and oral agent (dual therapy) (As well as metformin and Invokana.). He is compliant with treatment most of the time. His weight is decreasing steadily. He is following a generally unhealthy diet. He participates in exercise intermittently. His home blood glucose trend is decreasing steadily. His breakfast blood glucose range is generally 140-180 mg/dl. His lunch blood glucose range is generally 140-180 mg/dl. His dinner blood glucose range is generally 140-180 mg/dl. His bedtime blood glucose range is generally 140-180 mg/dl. His overall blood glucose range is 140-180 mg/dl. An ACE inhibitor/angiotensin II receptor blocker is not being taken.  Hyperlipidemia  This is a chronic problem. The current episode started more than 1 year ago. The problem is uncontrolled. Exacerbating diseases include diabetes and obesity. Pertinent negatives include no chest pain, myalgias or shortness of breath. Current antihyperlipidemic treatment includes statins. Risk factors for coronary artery disease include diabetes mellitus, dyslipidemia, hypertension, a sedentary lifestyle, obesity and male sex.  Hypertension  This is a chronic problem. The current episode started more than 1 year ago. Pertinent negatives include no chest pain, headaches, neck pain, palpitations or shortness of breath. Risk factors for coronary artery disease include dyslipidemia, diabetes mellitus, obesity and sedentary lifestyle.    Review of Systems  Constitutional: Negative for fatigue and unexpected weight change.  HENT: Negative for dental problem, mouth sores and trouble swallowing.   Eyes: Negative for visual disturbance.  Respiratory: Negative for cough, choking, chest tightness, shortness of breath and wheezing.   Cardiovascular: Negative for  chest pain, palpitations and leg swelling.  Gastrointestinal: Negative for abdominal distention, abdominal pain, constipation, diarrhea, nausea and vomiting.  Endocrine: Negative for polydipsia, polyphagia and polyuria.  Genitourinary: Positive for impotence. Negative for dysuria, flank pain, hematuria and urgency.  Musculoskeletal: Negative for back pain, gait problem, myalgias and neck pain.  Skin: Negative for pallor, rash and wound.  Neurological: Negative for seizures, syncope, weakness, numbness and headaches.  Psychiatric/Behavioral: Negative.  Negative for confusion and dysphoric mood.    Objective:    BP 118/76   Pulse 96   Ht 6' (1.829 m)   Wt 242 lb (109.8 kg)   BMI 32.82 kg/m   Wt Readings from Last 3 Encounters:  04/28/18 242 lb (109.8 kg)  01/27/18 249 lb (112.9 kg)  10/29/17 246 lb (111.6 kg)    Physical Exam  Constitutional: He is oriented to person, place, and time. He appears well-developed. He is cooperative. No distress.  HENT:  Head: Normocephalic and atraumatic.  Eyes: EOM are normal.  Neck: Normal range of motion. Neck supple. No tracheal deviation present. No thyromegaly present.  Cardiovascular:  Normal rate, S1 normal and S2 normal. Exam reveals no gallop.  No murmur heard. Pulses:      Dorsalis pedis pulses are 1+ on the right side, and 1+ on the left side.       Posterior tibial pulses are 1+ on the right side, and 1+ on the left side.  Pulmonary/Chest: Effort normal. No respiratory distress. He has no wheezes.  Abdominal: He exhibits no distension. There is no tenderness. There is no guarding and no CVA tenderness.  Musculoskeletal: He exhibits no edema.       Right shoulder: He exhibits no swelling and no deformity.  Neurological: He is alert and oriented to person, place, and time. He has normal strength. No cranial nerve deficit or sensory deficit. Gait normal.  Skin: Skin is warm and dry. No rash noted. No cyanosis. Nails show no clubbing.   Psychiatric: He has a normal mood and affect. His speech is normal. Judgment normal. Cognition and memory are normal.   Recent Results (from the past 2160 hour(s))  Basic metabolic panel     Status: None   Collection Time: 04/25/18 12:00 AM  Result Value Ref Range   BUN 10 4 - 21   Creatinine 0.9 0.6 - 1.3  Hemoglobin A1c     Status: None   Collection Time: 04/25/18 12:00 AM  Result Value Ref Range   Hemoglobin A1C 7.3    Lipid Panel     Component Value Date/Time   CHOL 126 07/03/2017   TRIG 212 (A) 07/03/2017   HDL 21 (A) 07/03/2017   LDLCALC 70 07/03/2017    Assessment & Plan:   1. Uncontrolled type 2 diabetes mellitus with complication, with long-term current use of insulin (HCC)  -He came with improved ischemic profile averaging 141 mg/dL, and F3L of 4.5%.   - Patient remains at a high risk for more acute and chronic complications of diabetes which include CAD, CVA, CKD, retinopathy, and neuropathy. These are all discussed in detail with the patient.   Recent labs reviewed, showing normal renal function.  - I have re-counseled the patient on diet management and weight loss  by adopting a carbohydrate restricted / protein rich  Diet.  -  Suggestion is made for him to avoid simple carbohydrates  from his diet including Cakes, Sweet Desserts / Pastries, Ice Cream, Soda (diet and regular), Sweet Tea, Candies, Chips, Cookies, Store Bought Juices, Alcohol in Excess of  1-2 drinks a day, Artificial Sweeteners, and "Sugar-free" Products. This will help patient to have stable blood glucose profile and potentially avoid unintended weight gain.   - Patient is advised to stick to a routine mealtimes to eat 3 meals  a day and avoid unnecessary snacks ( to snack only to correct hypoglycemia).   - I have approached patient with the following individualized plan to manage diabetes and patient agrees.  -His CGM glucose patterns summary reveals 82% time range, 70% above range.    He  will  continue to require intensive treatment with basal/bolus insulin to maintain control of diabetes in target.   - I advised him to continue documenting  blood glucose 4 times a day-before meals and at bedtime.  I encouraged him to use his Freestyle Libre device at all times.   -I will continue Tresiba 80 units nightly,  Humalog  18 units 3 times a day before meals for blood glucose readings pre-meal above 90 mg/dL (with additional correction for blood glucose readings above 150 mg/dL).  -Patient  is encouraged to call clinic for blood glucose levels less than 70 or above 300 mg /dl. -I will continue metformin 1000 mg by mouth twice a day. - I will continue Bydureon 2 mg subcutaneously weekly.    - Patient specific target  for A1c; LDL, HDL, Triglycerides, and  Waist Circumference were discussed in detail.  2) BP/HTN: His blood pressure is controlled to target.  He is advised to continue his blood pressure medications including lisinopril.    3) Lipids/HPL: His recent lipid panel showed controlled LDL at 70.  He is advised to continue simvastatin 10 mg p.o. nightly.   4)  Weight/Diet: CDE consult in progress, exercise, and carbohydrates information provided.  5) erectile dysfunction: He is benefiting from Viagra therapy.  I refilled this prescription per his request.    6) Chronic Care/Health Maintenance: -Patient is  on  medications and encouraged to continue to follow up with Ophthalmology, Podiatrist at least yearly or according to recommendations, and advised to  stay away from smoking. I have recommended yearly flu vaccine and pneumonia vaccination at least every 5 years; moderate intensity exercise for up to 150 minutes weekly; and  sleep for at least 7 hours a day.   - I advised patient to maintain close follow up with his PCP for primary care needs.  - Time spent with the patient: 25 min, of which >50% was spent in reviewing his blood glucose logs , discussing his hypo- and  hyper-glycemic episodes, reviewing his current and  previous labs and insulin doses and developing a plan to avoid hypo- and hyper-glycemia. Please refer to Patient Instructions for Blood Glucose Monitoring and Insulin/Medications Dosing Guide"  in media tab for additional information. George White participated in the discussions, expressed understanding, and voiced agreement with the above plans.  All questions were answered to his satisfaction. he is encouraged to contact clinic should he have any questions or concerns prior to his return visit.   Follow up plan: Return in about 4 months (around 08/29/2018) for meter, and logs.  Marquis Lunch, MD Phone: [phone]  Fax: [phone]   This note was partially dictated with voice recognition software. Similar sounding words can be transcribed inadequately or may not  be corrected upon review.  04/28/2018, 5:19 PM

## 2018-04-29 ENCOUNTER — Other Ambulatory Visit: Payer: Self-pay | Admitting: "Endocrinology

## 2018-06-04 ENCOUNTER — Telehealth: Payer: Self-pay | Admitting: "Endocrinology

## 2018-06-04 MED ORDER — INSULIN LISPRO 100 UNIT/ML (KWIKPEN): 18.0000 [IU] | PEN_INJECTOR | Freq: Three times a day (TID) | SUBCUTANEOUS | 2 refills | Status: DC

## 2018-06-04 NOTE — Telephone Encounter (Signed)
George White has changed pharmacies and needs a new Rx of insulin lispro (HUMALOG) 100 UNIT/ML KiwkPen  Sent to [facility]

## 2018-07-28 ENCOUNTER — Encounter: Payer: Self-pay | Admitting: "Endocrinology

## 2018-07-31 LAB — LIPID PANEL
Cholesterol: 120 (ref 0–200)
HDL: 25 — AB (ref 35–70)
LDL Cholesterol: 77
Triglycerides: 107 (ref 40–160)

## 2018-07-31 LAB — PSA: PSA: 0.264

## 2018-07-31 LAB — HEMOGLOBIN A1C: Hemoglobin A1C: 8.4

## 2018-07-31 LAB — BASIC METABOLIC PANEL
BUN: 16 (ref 4–21)
Creatinine: 0.8 (ref 0.6–1.3)

## 2018-07-31 LAB — CBC AND DIFFERENTIAL: HCT: 45 (ref 41–53)

## 2018-08-10 ENCOUNTER — Telehealth: Payer: Self-pay

## 2018-08-10 NOTE — Telephone Encounter (Signed)
error 

## 2018-08-25 ENCOUNTER — Encounter: Payer: Self-pay | Admitting: "Endocrinology

## 2018-08-25 ENCOUNTER — Ambulatory Visit (INDEPENDENT_AMBULATORY_CARE_PROVIDER_SITE_OTHER): Payer: BLUE CROSS/BLUE SHIELD | Admitting: "Endocrinology

## 2018-08-25 VITALS — BP 129/76 | HR 94 | Ht 72.0 in | Wt 246.0 lb

## 2018-08-25 DIAGNOSIS — E118 Type 2 diabetes mellitus with unspecified complications: Secondary | ICD-10-CM | POA: Diagnosis not present

## 2018-08-25 DIAGNOSIS — Z794 Long term (current) use of insulin: Secondary | ICD-10-CM

## 2018-08-25 DIAGNOSIS — E1165 Type 2 diabetes mellitus with hyperglycemia: Secondary | ICD-10-CM

## 2018-08-25 DIAGNOSIS — E782 Mixed hyperlipidemia: Secondary | ICD-10-CM | POA: Diagnosis not present

## 2018-08-25 DIAGNOSIS — I10 Essential (primary) hypertension: Secondary | ICD-10-CM | POA: Diagnosis not present

## 2018-08-25 DIAGNOSIS — IMO0002 Reserved for concepts with insufficient information to code with codable children: Secondary | ICD-10-CM

## 2018-08-25 MED ORDER — INSULIN LISPRO 100 UNIT/ML (KWIKPEN): 25.0000 [IU] | PEN_INJECTOR | Freq: Three times a day (TID) | SUBCUTANEOUS | 2 refills | Status: DC

## 2018-08-25 MED ORDER — FREESTYLE LIBRE 14 DAY SENSOR MISC: 1.0000 | 1 refills | Status: DC

## 2018-08-25 NOTE — Patient Instructions (Signed)

## 2018-08-25 NOTE — Progress Notes (Signed)
Endocrinology follow-up note   Subjective:    Patient ID: George White, male    DOB: 1961-03-08,    Past Medical History:  Diagnosis Date  . Diabetes mellitus, type II (HCC)   . Hyperlipidemia    Past Surgical History:  Procedure Laterality Date  . ROTATOR CUFF REPAIR     Social History   Socioeconomic History  . Marital status: Married    Spouse name: Not on file  . Number of children: Not on file  . Years of education: Not on file  . Highest education level: Not on file  Occupational History  . Not on file  Social Needs  . Financial resource strain: Not on file  . Food insecurity:    Worry: Not on file    Inability: Not on file  . Transportation needs:    Medical: Not on file    Non-medical: Not on file  Tobacco Use  . Smoking status: Never Smoker  . Smokeless tobacco: Never Used  Substance and Sexual Activity  . Alcohol use: No    Alcohol/week: 0.0 standard drinks  . Drug use: No  . Sexual activity: Not on file  Lifestyle  . Physical activity:    Days per week: Not on file    Minutes per session: Not on file  . Stress: Not on file  Relationships  . Social connections:    Talks on phone: Not on file    Gets together: Not on file    Attends religious service: Not on file    Active member of club or organization: Not on file    Attends meetings of clubs or organizations: Not on file    Relationship status: Not on file  Other Topics Concern  . Not on file  Social History Narrative  . Not on file   Outpatient Encounter Medications as of 08/25/2018  Medication Sig  . carvedilol (COREG) 3.125 MG tablet Take 3.125 mg by mouth 2 (two) times daily with a meal.  . Continuous Blood Gluc Sensor (FREESTYLE LIBRE 14 DAY SENSOR) MISC Inject 1 each into the skin every 14 (fourteen) days. Use as directed.  . Exenatide ER (BYDUREON) 2 MG PEN Inject 2 mg into the skin once a week.  . Insulin Degludec (TRESIBA FLEXTOUCH) 200 UNIT/ML SOPN Inject 80 Units into the  skin at bedtime. (Patient taking differently: Inject 90 Units into the skin at bedtime. )  . insulin lispro (HUMALOG) 100 UNIT/ML KiwkPen Inject 0.25-0.31 mLs (25-31 Units total) into the skin 3 (three) times daily before meals.  . Insulin Pen Needle (ULTICARE MICRO PEN NEEDLES) 32G X 4 MM MISC qid  . metFORMIN (GLUCOPHAGE) 1000 MG tablet Take 1 tablet (1,000 mg total) by mouth 2 (two) times daily.  . sildenafil (VIAGRA) 50 MG tablet Take 1 tablet (50 mg total) by mouth daily as needed for erectile dysfunction.  . simvastatin (ZOCOR) 10 MG tablet Take 1 tablet (10 mg total) by mouth at bedtime.  . [DISCONTINUED] Continuous Blood Gluc Sensor (FREESTYLE LIBRE 14 DAY SENSOR) MISC INJECT 1 EACH INTO THE SKIN EVERY 14 (FOURTEEN) DAYS. USE AS DIRECTED.  . [DISCONTINUED] insulin lispro (HUMALOG) 100 UNIT/ML KiwkPen Inject 0.18-0.24 mLs (18-24 Units total) into the skin 3 (three) times daily before meals. (Patient taking differently: Inject 22-28 Units into the skin 3 (three) times daily before meals. )  . [DISCONTINUED] lisinopril (PRINIVIL,ZESTRIL) 5 MG tablet Take 5 mg by mouth daily.   No facility-administered encounter medications on file as of  08/25/2018.    ALLERGIES: No Known Allergies VACCINATION STATUS:  There is no immunization history on file for this patient.  Diabetes  He presents for his follow-up diabetic visit. He has type 2 diabetes mellitus. Onset time: He was diagnosed at approximate age of 29 years. His disease course has been improving (Since his last visit to he started to register hyperglycemia in the range of 250-300 mg/dL. He was told to return with meter and logs showing blood glucose  readings 4 times a day.). There are no hypoglycemic associated symptoms. Pertinent negatives for hypoglycemia include no confusion, headaches, pallor or seizures. Pertinent negatives for diabetes include no chest pain, no fatigue, no polydipsia, no polyphagia, no polyuria and no weakness. There  are no hypoglycemic complications. Symptoms are improving. Pertinent negatives for diabetic complications include no impotence. Risk factors for coronary artery disease include diabetes mellitus, dyslipidemia, hypertension, male sex and sedentary lifestyle. Current diabetic treatment includes insulin injections and oral agent (dual therapy) (As well as metformin and Invokana.). He is compliant with treatment most of the time. His weight is fluctuating minimally. He is following a generally unhealthy diet. He participates in exercise intermittently. His home blood glucose trend is decreasing steadily. His breakfast blood glucose range is generally 180-200 mg/dl. His lunch blood glucose range is generally 180-200 mg/dl. His dinner blood glucose range is generally 180-200 mg/dl. His bedtime blood glucose range is generally 180-200 mg/dl. His overall blood glucose range is 180-200 mg/dl. An ACE inhibitor/angiotensin II receptor blocker is not being taken.  Hyperlipidemia  This is a chronic problem. The current episode started more than 1 year ago. The problem is uncontrolled. Exacerbating diseases include diabetes and obesity. Pertinent negatives include no chest pain, myalgias or shortness of breath. Current antihyperlipidemic treatment includes statins. Risk factors for coronary artery disease include diabetes mellitus, dyslipidemia, hypertension, a sedentary lifestyle, obesity and male sex.  Hypertension  This is a chronic problem. The current episode started more than 1 year ago. Pertinent negatives include no chest pain, headaches, neck pain, palpitations or shortness of breath. Risk factors for coronary artery disease include dyslipidemia, diabetes mellitus, obesity and sedentary lifestyle.    Review of Systems  Constitutional: Negative for fatigue and unexpected weight change.  HENT: Negative for dental problem, mouth sores and trouble swallowing.   Eyes: Negative for visual disturbance.  Respiratory:  Negative for cough, choking, chest tightness, shortness of breath and wheezing.   Cardiovascular: Negative for chest pain, palpitations and leg swelling.  Gastrointestinal: Negative for abdominal distention, abdominal pain, constipation, diarrhea, nausea and vomiting.  Endocrine: Negative for polydipsia, polyphagia and polyuria.  Genitourinary: Negative for dysuria, flank pain, hematuria, impotence and urgency.  Musculoskeletal: Negative for back pain, gait problem, myalgias and neck pain.  Skin: Negative for pallor, rash and wound.  Neurological: Negative for seizures, syncope, weakness, numbness and headaches.  Psychiatric/Behavioral: Negative.  Negative for confusion and dysphoric mood.    Objective:    BP 129/76   Pulse 94   Ht 6' (1.829 m)   Wt 246 lb (111.6 kg)   BMI 33.36 kg/m   Wt Readings from Last 3 Encounters:  08/25/18 246 lb (111.6 kg)  04/28/18 242 lb (109.8 kg)  01/27/18 249 lb (112.9 kg)    Physical Exam  Constitutional: He is oriented to person, place, and time. He appears well-developed. He is cooperative. No distress.  HENT:  Head: Normocephalic and atraumatic.  Eyes: EOM are normal.  Neck: Normal range of motion. Neck supple. No tracheal  deviation present. No thyromegaly present.  Cardiovascular: Normal rate, S1 normal and S2 normal. Exam reveals no gallop.  No murmur heard. Pulses:      Dorsalis pedis pulses are 1+ on the right side, and 1+ on the left side.       Posterior tibial pulses are 1+ on the right side, and 1+ on the left side.  Pulmonary/Chest: Effort normal. No respiratory distress. He has no wheezes.  Abdominal: He exhibits no distension. There is no tenderness. There is no guarding and no CVA tenderness.  Musculoskeletal: He exhibits no edema.       Right shoulder: He exhibits no swelling and no deformity.  Neurological: He is alert and oriented to person, place, and time. He has normal strength. No cranial nerve deficit or sensory deficit.  Gait normal.  Skin: Skin is warm and dry. No rash noted. No cyanosis. Nails show no clubbing.  Psychiatric: He has a normal mood and affect. His speech is normal. Judgment normal. Cognition and memory are normal.   Recent Results (from the past 2160 hour(s))  CBC and differential     Status: None   Collection Time: 07/31/18 12:00 AM  Result Value Ref Range   HCT 45 41 - 53  Basic metabolic panel     Status: None   Collection Time: 07/31/18 12:00 AM  Result Value Ref Range   BUN 16 4 - 21   Creatinine 0.8 0.6 - 1.3  Lipid panel     Status: Abnormal   Collection Time: 07/31/18 12:00 AM  Result Value Ref Range   Triglycerides 107 40 - 160   Cholesterol 120 0 - 200   HDL 25 (A) 35 - 70   LDL Cholesterol 77   Hemoglobin A1c     Status: None   Collection Time: 07/31/18 12:00 AM  Result Value Ref Range   Hemoglobin A1C 8.4   PSA     Status: None   Collection Time: 07/31/18 12:00 AM  Result Value Ref Range   PSA 0.264    Lipid Panel     Component Value Date/Time   CHOL 120 07/31/2018   TRIG 107 07/31/2018   HDL 25 (A) 07/31/2018   LDLCALC 77 07/31/2018    Assessment & Plan:   1. Uncontrolled type 2 diabetes mellitus with complication, with long-term current use of insulin (HCC)  -He came with higher glycemic profile and a1c of 8.4% increasing from  7.3%.   - Patient remains at a high risk for more acute and chronic complications of diabetes which include CAD, CVA, CKD, retinopathy, and neuropathy. These are all discussed in detail with the patient.   Recent labs reviewed, showing normal renal function.  - I have re-counseled the patient on diet management and weight loss  by adopting a carbohydrate restricted / protein rich  Diet.  -  Suggestion is made for him to avoid simple carbohydrates  from his diet including Cakes, Sweet Desserts / Pastries, Ice Cream, Soda (diet and regular), Sweet Tea, Candies, Chips, Cookies, Store Bought Juices, Alcohol in Excess of  1-2 drinks a  day, Artificial Sweeteners, and "Sugar-free" Products. This will help patient to have stable blood glucose profile and potentially avoid unintended weight gain.   - Patient is advised to stick to a routine mealtimes to eat 3 meals  a day and avoid unnecessary snacks ( to snack only to correct hypoglycemia).   - I have approached patient with the following individualized plan to manage diabetes  and patient agrees.  -His CGM glucose patterns summary reveals 35% time range, 64% above range.    He will  continue to require intensive treatment with basal/bolus insulin to maintain control of diabetes in target.   - I advised him to continue documenting  blood glucose 4 times a day-before meals and at bedtime.  I encouraged him to use his Freestyle Libre device at all times.   -I will continue Tresiba 90 units nightly,  Increase Humalog  to 25 units 3 times a day before meals for blood glucose readings pre-meal above 90 mg/dL (with additional correction for blood glucose readings above 150 mg/dL).  -Patient is encouraged to call clinic for blood glucose levels less than 70 or above 300 mg /dl. -I will continue metformin 1000 mg by mouth twice a day. - I will continue Bydureon 2 mg subcutaneously weekly.    - Patient specific target  for A1c; LDL, HDL, Triglycerides, and  Waist Circumference were discussed in detail.  2) BP/HTN: His blood pressure is controlled to target.  He is advised to continue his blood pressure medications including carvedilol.   3) Lipids/HPL: His recent lipid panel showed controlled LDL at 77.  He is advised to continue simvastatin 10 mg p.o. nightly.   4)  Weight/Diet: CDE consult in progress, exercise, and carbohydrates information provided.  5) erectile dysfunction: He is benefiting from Viagra therapy.  I refilled this prescription per his request.    6) Chronic Care/Health Maintenance: -Patient is  on  medications and encouraged to continue to follow up with  Ophthalmology, Podiatrist at least yearly or according to recommendations, and advised to  stay away from smoking. I have recommended yearly flu vaccine and pneumonia vaccination at least every 5 years; moderate intensity exercise for up to 150 minutes weekly; and  sleep for at least 7 hours a day.   - I advised patient to maintain close follow up with his PCP for primary care needs.  - Time spent with the patient: 25 min, of which >50% was spent in reviewing his blood glucose logs , discussing his hypo- and hyper-glycemic episodes, reviewing his current and  previous labs and insulin doses and developing a plan to avoid hypo- and hyper-glycemia. Please refer to Patient Instructions for Blood Glucose Monitoring and Insulin/Medications Dosing Guide"  in media tab for additional information. [name] participated in the discussions, expressed understanding, and voiced agreement with the above plans.  All questions were answered to his satisfaction. he is encouraged to contact clinic should he have any questions or concerns prior to his return visit.   Follow up plan: Return in about 4 months (around 12/26/2018) for Meter, and Logs.  Marquis Lunch, MD Phone: [phone]  Fax: [phone]   This note was partially dictated with voice recognition software. Similar sounding words can be transcribed inadequately or may not  be corrected upon review.  08/25/2018, 5:03 PM

## 2018-09-11 ENCOUNTER — Telehealth: Payer: Self-pay | Admitting: "Endocrinology

## 2018-09-11 NOTE — Telephone Encounter (Signed)
Please refill Exenatide ER (BYDUREON) 2 MG PEN, metFORMIN (GLUCOPHAGE) 1000 MG tablet, simvastatin (ZOCOR) 10 MG tablet please advise?

## 2018-09-14 MED ORDER — SIMVASTATIN 10 MG PO TABS: 10.0000 mg | ORAL_TABLET | Freq: Every day | ORAL | 1 refills | Status: DC

## 2018-09-14 MED ORDER — METFORMIN HCL 1000 MG PO TABS: 1000.0000 mg | ORAL_TABLET | Freq: Two times a day (BID) | ORAL | 0 refills | Status: DC

## 2018-09-14 MED ORDER — EXENATIDE ER 2 MG ~~LOC~~ PEN: 2.0000 mg | PEN_INJECTOR | SUBCUTANEOUS | 0 refills | Status: DC

## 2018-09-18 ENCOUNTER — Other Ambulatory Visit: Payer: Self-pay | Admitting: "Endocrinology

## 2018-11-16 ENCOUNTER — Other Ambulatory Visit: Payer: Self-pay

## 2018-11-16 MED ORDER — INSULIN DEGLUDEC 200 UNIT/ML ~~LOC~~ SOPN: 80.0000 [IU] | PEN_INJECTOR | Freq: Every day | SUBCUTANEOUS | 0 refills | Status: DC

## 2018-12-07 ENCOUNTER — Other Ambulatory Visit: Payer: Self-pay | Admitting: "Endocrinology

## 2018-12-29 ENCOUNTER — Ambulatory Visit: Payer: BLUE CROSS/BLUE SHIELD | Admitting: "Endocrinology

## 2018-12-30 LAB — BASIC METABOLIC PANEL
BUN: 8 (ref 4–21)
Creatinine: 0.8 (ref 0.6–1.3)

## 2018-12-30 LAB — HEMOGLOBIN A1C: Hemoglobin A1C: 9.6

## 2019-01-12 ENCOUNTER — Encounter: Payer: Self-pay | Admitting: "Endocrinology

## 2019-01-12 ENCOUNTER — Other Ambulatory Visit: Payer: Self-pay | Admitting: "Endocrinology

## 2019-01-12 ENCOUNTER — Ambulatory Visit (INDEPENDENT_AMBULATORY_CARE_PROVIDER_SITE_OTHER): Payer: BLUE CROSS/BLUE SHIELD | Admitting: "Endocrinology

## 2019-01-12 ENCOUNTER — Other Ambulatory Visit: Payer: Self-pay

## 2019-01-12 VITALS — BP 135/81 | HR 92 | Ht 72.0 in | Wt 246.0 lb

## 2019-01-12 DIAGNOSIS — E782 Mixed hyperlipidemia: Secondary | ICD-10-CM | POA: Diagnosis not present

## 2019-01-12 DIAGNOSIS — E118 Type 2 diabetes mellitus with unspecified complications: Secondary | ICD-10-CM | POA: Diagnosis not present

## 2019-01-12 DIAGNOSIS — Z794 Long term (current) use of insulin: Secondary | ICD-10-CM

## 2019-01-12 DIAGNOSIS — I10 Essential (primary) hypertension: Secondary | ICD-10-CM | POA: Diagnosis not present

## 2019-01-12 DIAGNOSIS — IMO0002 Reserved for concepts with insufficient information to code with codable children: Secondary | ICD-10-CM

## 2019-01-12 DIAGNOSIS — E1165 Type 2 diabetes mellitus with hyperglycemia: Secondary | ICD-10-CM | POA: Diagnosis not present

## 2019-01-12 MED ORDER — INSULIN LISPRO 200 UNIT/ML ~~LOC~~ SOPN: 25.0000 [IU] | PEN_INJECTOR | Freq: Three times a day (TID) | SUBCUTANEOUS | 2 refills | Status: DC

## 2019-01-12 MED ORDER — INSULIN DEGLUDEC 200 UNIT/ML ~~LOC~~ SOPN: 100.0000 [IU] | PEN_INJECTOR | Freq: Every day | SUBCUTANEOUS | 0 refills | Status: DC

## 2019-01-12 NOTE — Progress Notes (Signed)
Endocrinology follow-up note   Subjective:    Patient ID: George White, male    DOB: 01/05/1961,    Past Medical History:  Diagnosis Date  . Diabetes mellitus, type II (HCC)   . Hyperlipidemia    Past Surgical History:  Procedure Laterality Date  . ROTATOR CUFF REPAIR     Social History   Socioeconomic History  . Marital status: Married    Spouse name: Not on file  . Number of children: Not on file  . Years of education: Not on file  . Highest education level: Not on file  Occupational History  . Not on file  Social Needs  . Financial resource strain: Not on file  . Food insecurity:    Worry: Not on file    Inability: Not on file  . Transportation needs:    Medical: Not on file    Non-medical: Not on file  Tobacco Use  . Smoking status: Never Smoker  . Smokeless tobacco: Never Used  Substance and Sexual Activity  . Alcohol use: No    Alcohol/week: 0.0 standard drinks  . Drug use: No  . Sexual activity: Not on file  Lifestyle  . Physical activity:    Days per week: Not on file    Minutes per session: Not on file  . Stress: Not on file  Relationships  . Social connections:    Talks on phone: Not on file    Gets together: Not on file    Attends religious service: Not on file    Active member of club or organization: Not on file    Attends meetings of clubs or organizations: Not on file    Relationship status: Not on file  Other Topics Concern  . Not on file  Social History Narrative  . Not on file   Outpatient Encounter Medications as of 01/12/2019  Medication Sig  . [DISCONTINUED] insulin lispro (HUMALOG) 100 UNIT/ML KwikPen Inject 25-31 Units into the skin 3 (three) times daily before meals.  . carvedilol (COREG) 3.125 MG tablet Take 3.125 mg by mouth 2 (two) times daily with a meal.  . Exenatide ER (BYDUREON) 2 MG PEN Inject 2 mg into the skin once a week.  . Insulin Degludec (TRESIBA FLEXTOUCH) 200 UNIT/ML SOPN Inject 100 Units into the skin at  bedtime.  . Insulin Lispro (HUMALOG KWIKPEN) 200 UNIT/ML SOPN Inject 25-31 Units into the skin 3 (three) times daily before meals.  . Insulin Pen Needle (ULTICARE MICRO PEN NEEDLES) 32G X 4 MM MISC qid  . metFORMIN (GLUCOPHAGE) 1000 MG tablet TAKE 1 TABLET BY MOUTH TWICE A DAY  . sildenafil (VIAGRA) 50 MG tablet TAKE 1 TABLET AS DIRECTED  . simvastatin (ZOCOR) 10 MG tablet Take 1 tablet (10 mg total) by mouth at bedtime.  . [DISCONTINUED] Insulin Degludec (TRESIBA FLEXTOUCH) 200 UNIT/ML SOPN Inject 80 Units into the skin at bedtime.  . [DISCONTINUED] insulin lispro (HUMALOG) 100 UNIT/ML KiwkPen Inject 0.25-0.31 mLs (25-31 Units total) into the skin 3 (three) times daily before meals.   No facility-administered encounter medications on file as of 01/12/2019.    ALLERGIES: No Known Allergies VACCINATION STATUS:  There is no immunization history on file for this patient.  Diabetes  He presents for his follow-up diabetic visit. He has type 2 diabetes mellitus. Onset time: He was diagnosed at approximate age of 29 years. His disease course has been worsening (Since his last visit to he started to register hyperglycemia in the range of 250-300  mg/dL. He was told to return with meter and logs showing blood glucose  readings 4 times a day.). There are no hypoglycemic associated symptoms. Pertinent negatives for hypoglycemia include no confusion, headaches, pallor or seizures. Associated symptoms include polydipsia and polyuria. Pertinent negatives for diabetes include no chest pain, no fatigue, no polyphagia and no weakness. There are no hypoglycemic complications. Symptoms are worsening. Pertinent negatives for diabetic complications include no impotence. Risk factors for coronary artery disease include diabetes mellitus, dyslipidemia, hypertension, male sex and sedentary lifestyle. Current diabetic treatment includes insulin injections and oral agent (dual therapy) (As well as metformin and Invokana.). He  is compliant with treatment most of the time. His weight is fluctuating minimally. He is following a generally unhealthy diet. He participates in exercise intermittently. His home blood glucose trend is decreasing steadily. His breakfast blood glucose range is generally 180-200 mg/dl. His lunch blood glucose range is generally 180-200 mg/dl. His dinner blood glucose range is generally 180-200 mg/dl. His bedtime blood glucose range is generally 180-200 mg/dl. His overall blood glucose range is 180-200 mg/dl. An ACE inhibitor/angiotensin II receptor blocker is not being taken.  Hyperlipidemia  This is a chronic problem. The current episode started more than 1 year ago. The problem is uncontrolled. Exacerbating diseases include diabetes and obesity. Pertinent negatives include no chest pain, myalgias or shortness of breath. Current antihyperlipidemic treatment includes statins. Risk factors for coronary artery disease include diabetes mellitus, dyslipidemia, hypertension, a sedentary lifestyle, obesity and male sex.  Hypertension  This is a chronic problem. The current episode started more than 1 year ago. Pertinent negatives include no chest pain, headaches, neck pain, palpitations or shortness of breath. Risk factors for coronary artery disease include dyslipidemia, diabetes mellitus, obesity and sedentary lifestyle.    Review of Systems  Constitutional: Negative for fatigue and unexpected weight change.  HENT: Negative for dental problem, mouth sores and trouble swallowing.   Eyes: Negative for visual disturbance.  Respiratory: Negative for cough, choking, chest tightness, shortness of breath and wheezing.   Cardiovascular: Negative for chest pain, palpitations and leg swelling.  Gastrointestinal: Negative for abdominal distention, abdominal pain, constipation, diarrhea, nausea and vomiting.  Endocrine: Positive for polydipsia and polyuria. Negative for polyphagia.  Genitourinary: Negative for  dysuria, flank pain, hematuria, impotence and urgency.  Musculoskeletal: Negative for back pain, gait problem, myalgias and neck pain.  Skin: Negative for pallor, rash and wound.  Neurological: Negative for seizures, syncope, weakness, numbness and headaches.  Psychiatric/Behavioral: Negative.  Negative for confusion and dysphoric mood.    Objective:    BP 135/81   Pulse 92   Ht 6' (1.829 m)   Wt 246 lb (111.6 kg)   BMI 33.36 kg/m   Wt Readings from Last 3 Encounters:  01/12/19 246 lb (111.6 kg)  08/25/18 246 lb (111.6 kg)  04/28/18 242 lb (109.8 kg)    Physical Exam  Constitutional: He is oriented to person, place, and time. He appears well-developed. He is cooperative. No distress.  HENT:  Head: Normocephalic and atraumatic.  Eyes: EOM are normal.  Neck: Normal range of motion. Neck supple. No tracheal deviation present. No thyromegaly present.  Cardiovascular: Normal rate, S1 normal and S2 normal. Exam reveals no gallop.  No murmur heard. Pulses:      Dorsalis pedis pulses are 1+ on the right side and 1+ on the left side.       Posterior tibial pulses are 1+ on the right side and 1+ on the left side.  Pulmonary/Chest: Effort normal. No respiratory distress. He has no wheezes.  Abdominal: He exhibits no distension. There is no abdominal tenderness. There is no guarding and no CVA tenderness.  Musculoskeletal:        General: No edema.     Right shoulder: He exhibits no swelling and no deformity.  Neurological: He is alert and oriented to person, place, and time. He has normal strength. No cranial nerve deficit or sensory deficit. Gait normal.  Skin: Skin is warm and dry. No rash noted. No cyanosis. Nails show no clubbing.  Psychiatric: He has a normal mood and affect. His speech is normal. Judgment normal. Cognition and memory are normal.   Recent Results (from the past 2160 hour(s))  Basic metabolic panel     Status: None   Collection Time: 12/30/18 12:00 AM  Result  Value Ref Range   BUN 8 4 - 21   Creatinine 0.8 0.6 - 1.3  Hemoglobin A1c     Status: None   Collection Time: 12/30/18 12:00 AM  Result Value Ref Range   Hemoglobin A1C 9.6    Lipid Panel     Component Value Date/Time   CHOL 120 07/31/2018   TRIG 107 07/31/2018   HDL 25 (A) 07/31/2018   LDLCALC 77 07/31/2018    Assessment & Plan:   1. Uncontrolled type 2 diabetes mellitus with complication, with long-term current use of insulin (HCC)  -He came with higher glycemic profile and a1c of  9.6% increasing from 7.3%.    - Patient remains at a high risk for more acute and chronic complications of diabetes which include CAD, CVA, CKD, retinopathy, and neuropathy. These are all discussed in detail with the patient.   Recent labs reviewed, showing normal renal function.  - I have re-counseled the patient on diet management and weight loss  by adopting a carbohydrate restricted / protein rich  Diet.  - Patient admits there is a room for improvement in his diet and drink choices. -  Suggestion is made for him to avoid simple carbohydrates  from his diet including Cakes, Sweet Desserts / Pastries, Ice Cream, Soda (diet and regular), Sweet Tea, Candies, Chips, Cookies, Store Bought Juices, Alcohol in Excess of  1-2 drinks a day, Artificial Sweeteners, and "Sugar-free" Products. This will help patient to have stable blood glucose profile and potentially avoid unintended weight gain.  - Patient is advised to stick to a routine mealtimes to eat 3 meals  a day and avoid unnecessary snacks ( to snack only to correct hypoglycemia).   - I have approached patient with the following individualized plan to manage diabetes and patient agrees.  -His CGM glucose patterns summary reveals 55% time range, 45% above range.  He reports exposure to steroids in the interim.   He will  continue to require intensive treatment with basal/bolus insulin to maintain control of diabetes in target.   -He is advised to  continue  documenting  blood glucose 4 times a day-before meals and at bedtime.  I encouraged him to use his Freestyle Libre device at all times.   -He is advised to increase [place] to 100 units nightly, continue Humalog  25 units 3 times a day before meals for blood glucose readings pre-meal above 90 mg/dL (with additional correction for blood glucose readings above 150 mg/dL).  -Patient is encouraged to call clinic for blood glucose levels less than 70 or above 300 mg /dl. -He is advised to continue metformin  1000 mg by  mouth twice a day. -He is advised to continue Bydureon 2 mg subcutaneously weekly.    - Patient specific target  for A1c; LDL, HDL, Triglycerides, and  Waist Circumference were discussed in detail.  2) BP/HTN: His blood pressure is controlled to target.  He is advised to continue  his blood pressure medications including carvedilol.   3) Lipids/HPL: His recent lipid panel showed controlled LDL at 77.  He is advised to continue simvastatin 10 mg p.o. nightly.    4)  Weight/Diet: CDE consult in progress, exercise, and carbohydrates information provided.  5) erectile dysfunction: He is benefiting from Viagra therapy.  I refilled this prescription per his request.    6) Chronic Care/Health Maintenance: -Patient is  on  medications and encouraged to continue to follow up with Ophthalmology, Podiatrist at least yearly or according to recommendations, and advised to  stay away from smoking. I have recommended yearly flu vaccine and pneumonia vaccination at least every 5 years; moderate intensity exercise for up to 150 minutes weekly; and  sleep for at least 7 hours a day.   - I advised patient to maintain close follow up with his PCP for primary care needs.  - Time spent with the patient: 25 min, of which >50% was spent in reviewing his blood glucose logs , discussing his hypoglycemia and hyperglycemia episodes, reviewing his current and  previous labs / studies and medications   doses and developing a plan to avoid hypoglycemia and hyperglycemia. Please refer to Patient Instructions for Blood Glucose Monitoring and Insulin/Medications Dosing Guide"  in media tab for additional information. Please  also refer to " Patient Self Inventory" in the Media  tab for reviewed elements of pertinent patient history.  George White participated in the discussions, expressed understanding, and voiced agreement with the above plans.  All questions were answered to his satisfaction. he is encouraged to contact clinic should he have any questions or concerns prior to his return visit.    Follow up plan: Return in about 3 months (around 04/14/2019) for Meter, and Logs.  George LunchGebre Leyla Soliz, MD Phone: [phone]  Fax: [phone]   This note was partially dictated with voice recognition software. Similar sounding words can be transcribed inadequately or may not  be corrected upon review.  01/12/2019, 4:10 PM

## 2019-01-12 NOTE — Patient Instructions (Signed)

## 2019-02-03 ENCOUNTER — Other Ambulatory Visit: Payer: Self-pay | Admitting: "Endocrinology

## 2019-03-30 ENCOUNTER — Telehealth: Payer: Self-pay | Admitting: "Endocrinology

## 2019-03-30 NOTE — Telephone Encounter (Signed)
Route

## 2019-03-30 NOTE — Telephone Encounter (Signed)
Patient said his mail order pharmacy is only sending him 3 weeks worth of Humalog. He said that is not lasting him. He would like to get a 90 day supply.

## 2019-03-31 MED ORDER — INSULIN DEGLUDEC 200 UNIT/ML ~~LOC~~ SOPN: 100.0000 [IU] | PEN_INJECTOR | Freq: Every day | SUBCUTANEOUS | 0 refills | Status: DC

## 2019-03-31 NOTE — Telephone Encounter (Signed)
refill sent 

## 2019-04-06 ENCOUNTER — Other Ambulatory Visit: Payer: Self-pay

## 2019-04-06 MED ORDER — INSULIN LISPRO 200 UNIT/ML ~~LOC~~ SOPN: 25.0000 [IU] | PEN_INJECTOR | Freq: Three times a day (TID) | SUBCUTANEOUS | 0 refills | Status: DC

## 2019-04-06 NOTE — Telephone Encounter (Signed)
Rx sent again

## 2019-04-06 NOTE — Addendum Note (Signed)
Addended by: Lavell Luster A on: 04/06/2019 04:37 PM   Modules accepted: Orders

## 2019-04-06 NOTE — Telephone Encounter (Signed)
Pt is calling to check status of his Humalog. He said he was told it was being worked on.

## 2019-04-08 LAB — HEMOGLOBIN A1C: Hemoglobin A1C: 9.6

## 2019-04-08 LAB — BASIC METABOLIC PANEL
BUN: 10 (ref 4–21)
Creatinine: 0.8 (ref 0.6–1.3)

## 2019-04-14 ENCOUNTER — Other Ambulatory Visit: Payer: Self-pay

## 2019-04-14 ENCOUNTER — Ambulatory Visit (INDEPENDENT_AMBULATORY_CARE_PROVIDER_SITE_OTHER): Payer: BC Managed Care – PPO | Admitting: "Endocrinology

## 2019-04-14 ENCOUNTER — Encounter: Payer: Self-pay | Admitting: "Endocrinology

## 2019-04-14 DIAGNOSIS — E782 Mixed hyperlipidemia: Secondary | ICD-10-CM | POA: Diagnosis not present

## 2019-04-14 DIAGNOSIS — E1165 Type 2 diabetes mellitus with hyperglycemia: Secondary | ICD-10-CM

## 2019-04-14 DIAGNOSIS — I10 Essential (primary) hypertension: Secondary | ICD-10-CM | POA: Diagnosis not present

## 2019-04-14 DIAGNOSIS — IMO0002 Reserved for concepts with insufficient information to code with codable children: Secondary | ICD-10-CM

## 2019-04-14 DIAGNOSIS — E118 Type 2 diabetes mellitus with unspecified complications: Secondary | ICD-10-CM | POA: Diagnosis not present

## 2019-04-14 DIAGNOSIS — Z794 Long term (current) use of insulin: Secondary | ICD-10-CM

## 2019-04-14 MED ORDER — HUMALOG KWIKPEN 200 UNIT/ML ~~LOC~~ SOPN: 30.0000 [IU] | PEN_INJECTOR | Freq: Three times a day (TID) | SUBCUTANEOUS | 1 refills | Status: DC

## 2019-04-14 NOTE — Progress Notes (Signed)
Endocrinology Telehealth Visit 04/14/2019   Follow up Note -During COVID -19 Pandemic  This visit type was conducted due to national recommendations for restrictions regarding the COVID-19 Pandemic  in an effort to limit this patient's exposure and mitigate transmission of the corona virus.  Due to his co-morbid illnesses, George White is at  moderate to high risk for complications without adequate follow up.  This format is felt to be most appropriate for him at this time.  I connected with this patient on 04/14/2019   by telephone and verified that I am speaking with the correct person using two identifiers. George White, [birth year]. he has verbally consented to this visit. All issues noted in this document were discussed and addressed. The format was not optimal for physical exam.     Subjective:    Patient ID: George White, male    DOB: Mar 10, 1961,    Past Medical History:  Diagnosis Date  . Diabetes mellitus, type II (HCC)   . Hyperlipidemia    Past Surgical History:  Procedure Laterality Date  . ROTATOR CUFF REPAIR     Social History   Socioeconomic History  . Marital status: Married    Spouse name: Not on file  . Number of children: Not on file  . Years of education: Not on file  . Highest education level: Not on file  Occupational History  . Not on file  Social Needs  . Financial resource strain: Not on file  . Food insecurity    Worry: Not on file    Inability: Not on file  . Transportation needs    Medical: Not on file    Non-medical: Not on file  Tobacco Use  . Smoking status: Never Smoker  . Smokeless tobacco: Never Used  Substance and Sexual Activity  . Alcohol use: No    Alcohol/week: 0.0 standard drinks  . Drug use: No  . Sexual activity: Not on file  Lifestyle  . Physical activity    Days per week: Not on file    Minutes per session: Not on file  . Stress: Not on file  Relationships  .  Social Musician on phone: Not on file    Gets together: Not on file    Attends religious service: Not on file    Active member of club or organization: Not on file    Attends meetings of clubs or organizations: Not on file    Relationship status: Not on file  Other Topics Concern  . Not on file  Social History Narrative  . Not on file   Outpatient Encounter Medications as of 04/14/2019  Medication Sig  . carvedilol (COREG) 3.125 MG tablet Take 3.125 mg by mouth 2 (two) times daily with a meal.  . Continuous Blood Gluc Sensor (FREESTYLE LIBRE 14 DAY SENSOR) MISC INJECT 1 EACH INTO THE SKIN EVERY 14 DAYS. USE AS DIRECTED.  Marland Kitchen Exenatide ER (BYDUREON) 2 MG PEN Inject 2 mg into the skin once a week.  . Insulin Degludec (TRESIBA FLEXTOUCH) 200 UNIT/ML SOPN Inject 100 Units into the skin at bedtime.  . Insulin Lispro (HUMALOG KWIKPEN) 200 UNIT/ML SOPN Inject 30-36 Units into  the skin 3 (three) times daily before meals.  . Insulin Pen Needle (ULTICARE MICRO PEN NEEDLES) 32G X 4 MM MISC qid  . metFORMIN (GLUCOPHAGE) 1000 MG tablet TAKE 1 TABLET BY MOUTH TWICE A DAY  . sildenafil (VIAGRA) 50 MG tablet TAKE 1 TABLET AS DIRECTED  . simvastatin (ZOCOR) 10 MG tablet Take 1 tablet (10 mg total) by mouth at bedtime.  . [DISCONTINUED] Insulin Lispro (HUMALOG KWIKPEN) 200 UNIT/ML SOPN Inject 25-31 Units into the skin 3 (three) times daily before meals.   No facility-administered encounter medications on file as of 04/14/2019.    ALLERGIES: No Known Allergies VACCINATION STATUS:  There is no immunization history on file for this patient.  Diabetes He presents for his follow-up diabetic visit. He has type 2 diabetes mellitus. Onset time: He was diagnosed at approximate age of 40 years. His disease course has been worsening (Since his last visit to he started to register hyperglycemia in the range of 250-300 mg/dL. He was told to return with meter and logs showing blood glucose  readings 4  times a day.). There are no hypoglycemic associated symptoms. Pertinent negatives for hypoglycemia include no confusion, headaches, pallor or seizures. Associated symptoms include polydipsia and polyuria. Pertinent negatives for diabetes include no chest pain, no fatigue, no polyphagia and no weakness. There are no hypoglycemic complications. Symptoms are worsening. Pertinent negatives for diabetic complications include no impotence. Risk factors for coronary artery disease include diabetes mellitus, dyslipidemia, hypertension, male sex and sedentary lifestyle. Current diabetic treatment includes insulin injections and oral agent (dual therapy) (As well as metformin and Invokana.). He is compliant with treatment most of the time. His weight is fluctuating minimally. He is following a generally unhealthy diet. He participates in exercise intermittently. His home blood glucose trend is decreasing steadily. His breakfast blood glucose range is generally 180-200 mg/dl. His lunch blood glucose range is generally 180-200 mg/dl. His dinner blood glucose range is generally 180-200 mg/dl. His bedtime blood glucose range is generally 180-200 mg/dl. His overall blood glucose range is 180-200 mg/dl. An ACE inhibitor/angiotensin II receptor blocker is not being taken.  Hyperlipidemia This is a chronic problem. The current episode started more than 1 year ago. The problem is uncontrolled. Exacerbating diseases include diabetes and obesity. Pertinent negatives include no chest pain, myalgias or shortness of breath. Current antihyperlipidemic treatment includes statins. Risk factors for coronary artery disease include diabetes mellitus, dyslipidemia, hypertension, a sedentary lifestyle, obesity and male sex.  Hypertension This is a chronic problem. The current episode started more than 1 year ago. Pertinent negatives include no chest pain, headaches, neck pain, palpitations or shortness of breath. Risk factors for coronary  artery disease include dyslipidemia, diabetes mellitus, obesity and sedentary lifestyle.    Review of Systems  Constitutional: Negative for fatigue and unexpected weight change.  HENT: Negative for dental problem, mouth sores and trouble swallowing.   Eyes: Negative for visual disturbance.  Respiratory: Negative for cough, choking, chest tightness, shortness of breath and wheezing.   Cardiovascular: Negative for chest pain, palpitations and leg swelling.  Gastrointestinal: Negative for abdominal distention, abdominal pain, constipation, diarrhea, nausea and vomiting.  Endocrine: Positive for polydipsia and polyuria. Negative for polyphagia.  Genitourinary: Negative for dysuria, flank pain, hematuria, impotence and urgency.  Musculoskeletal: Negative for back pain, gait problem, myalgias and neck pain.  Skin: Negative for pallor, rash and wound.  Neurological: Negative for seizures, syncope, weakness, numbness and headaches.  Psychiatric/Behavioral: Negative.  Negative for confusion and dysphoric mood.  Objective:    There were no vitals taken for this visit.  Wt Readings from Last 3 Encounters:  01/12/19 246 lb (111.6 kg)  08/25/18 246 lb (111.6 kg)  04/28/18 242 lb (109.8 kg)    No results found for this or any previous visit (from the past 2160 hour(s)). Lipid Panel     Component Value Date/Time   CHOL 120 07/31/2018   TRIG 107 07/31/2018   HDL 25 (A) 07/31/2018   LDLCALC 77 07/31/2018    Assessment & Plan:   1. Uncontrolled type 2 diabetes mellitus with complication, with long-term current use of insulin (HCC)  -He reports significantly above target glycemic profile averaging 192 in the last 30 days.  And his previsit A1c was unchanged at 9.6%.    - Patient remains at a high risk for more acute and chronic complications of diabetes which include CAD, CVA, CKD, retinopathy, and neuropathy. These are all discussed in detail with the patient.   Recent labs reviewed,  showing normal renal function.  - I have re-counseled the patient on diet management and weight loss  by adopting a carbohydrate restricted / protein rich  Diet.  - he  admits there is a room for improvement in his diet and drink choices. -  Suggestion is made for him to avoid simple carbohydrates  from his diet including Cakes, Sweet Desserts / Pastries, Ice Cream, Soda (diet and regular), Sweet Tea, Candies, Chips, Cookies, Sweet Pastries,  Store Bought Juices, Alcohol in Excess of  1-2 drinks a day, Artificial Sweeteners, Coffee Creamer, and "Sugar-free" Products. This will help patient to have stable blood glucose profile and potentially avoid unintended weight gain.   - Patient is advised to stick to a routine mealtimes to eat 3 meals  a day and avoid unnecessary snacks ( to snack only to correct hypoglycemia).   - I have approached patient with the following individualized plan to manage diabetes and patient agrees.  -He reports significant glycemic burden and A1c is still high at 9.6%.   He will  continue to require intensive treatment with basal/bolus insulin to maintain control of diabetes in target.   -He is advised to continue  documenting  blood glucose 4 times a day-before meals and at bedtime.  I encouraged him to use his Freestyle Libre device at all times.   -He is advised to continue Tresiba 100 units nightly, increase Humalog to 30 units 3 times a day before meals for blood glucose readings pre-meal above 90 mg/dL (with additional correction for blood glucose readings above 150 mg/dL).  -Patient is encouraged to call clinic for blood glucose levels less than 70 or above 300 mg /dl. -He is advised to continue metformin  1000 mg by mouth twice a day. -He is advised to continue Bydureon 2 mg subcutaneously weekly.    - Patient specific target  for A1c; LDL, HDL, Triglycerides, and  Waist Circumference were discussed in detail.  2) BP/HTN: His blood pressure is controlled to  target.  He is advised to continue  his blood pressure medications including carvedilol.   3) Lipids/HPL: His recent lipid panel showed controlled LDL at 77.  He is advised to continue simvastatin 10 mg p.o. nightly.    4)  Weight/Diet: CDE consult in progress, exercise, and carbohydrates information provided.  5) erectile dysfunction: He is benefiting from Viagra therapy.  I refilled this prescription per his request.    6) Chronic Care/Health Maintenance: -Patient is  on  medications and  encouraged to continue to follow up with Ophthalmology, Podiatrist at least yearly or according to recommendations, and advised to  stay away from smoking. I have recommended yearly flu vaccine and pneumonia vaccination at least every 5 years; moderate intensity exercise for up to 150 minutes weekly; and  sleep for at least 7 hours a day.   - I advised patient to maintain close follow up with his PCP for primary care needs.  -- Time spent with the patient: 25 min, of which >50% was spent in reviewing his  current and  previous labs/studies, his blood glucose readings, previous treatments, and medications doses and developing a plan for long-term care based on the latest recommendations for standards of care. Please refer to " Patient Self Inventory" in the Media  tab for reviewed elements of pertinent patient history. George White participated in the discussions, expressed understanding, and voiced agreement with the above plans.  All questions were answered to his satisfaction. he is encouraged to contact clinic should he have any questions or concerns prior to his return visit.  Follow up plan: Return in about 3 months (around 07/15/2019) for Follow up with Pre-visit Labs, Meter, and Logs.  Marquis LunchGebre Cortnee Steinmiller, MD Phone: [phone]  Fax: [phone]   This note was partially dictated with voice recognition software. Similar sounding words can be transcribed inadequately or may not  be corrected upon  review.  04/14/2019, 5:56 PM

## 2019-04-29 ENCOUNTER — Other Ambulatory Visit: Payer: Self-pay | Admitting: "Endocrinology

## 2019-05-14 ENCOUNTER — Other Ambulatory Visit: Payer: Self-pay | Admitting: "Endocrinology

## 2019-05-21 ENCOUNTER — Other Ambulatory Visit: Payer: Self-pay | Admitting: "Endocrinology

## 2019-07-19 ENCOUNTER — Ambulatory Visit: Payer: BC Managed Care – PPO | Admitting: "Endocrinology

## 2019-08-03 LAB — HEPATIC FUNCTION PANEL
ALT: 24 (ref 10–40)
AST: 17 (ref 14–40)
Bilirubin, Total: 0.4

## 2019-08-03 LAB — BASIC METABOLIC PANEL
BUN: 13 (ref 4–21)
Potassium: 4.2 (ref 3.4–5.3)
Sodium: 136 — AB (ref 137–147)

## 2019-08-03 LAB — HEMOGLOBIN A1C: Hemoglobin A1C: 10.2

## 2019-08-18 ENCOUNTER — Other Ambulatory Visit: Payer: Self-pay

## 2019-08-18 ENCOUNTER — Encounter: Payer: Self-pay | Admitting: "Endocrinology

## 2019-08-18 ENCOUNTER — Ambulatory Visit (INDEPENDENT_AMBULATORY_CARE_PROVIDER_SITE_OTHER): Payer: BC Managed Care – PPO | Admitting: "Endocrinology

## 2019-08-18 DIAGNOSIS — I10 Essential (primary) hypertension: Secondary | ICD-10-CM | POA: Diagnosis not present

## 2019-08-18 DIAGNOSIS — E782 Mixed hyperlipidemia: Secondary | ICD-10-CM

## 2019-08-18 DIAGNOSIS — IMO0002 Reserved for concepts with insufficient information to code with codable children: Secondary | ICD-10-CM

## 2019-08-18 DIAGNOSIS — E1165 Type 2 diabetes mellitus with hyperglycemia: Secondary | ICD-10-CM | POA: Diagnosis not present

## 2019-08-18 DIAGNOSIS — E118 Type 2 diabetes mellitus with unspecified complications: Secondary | ICD-10-CM | POA: Diagnosis not present

## 2019-08-18 DIAGNOSIS — Z794 Long term (current) use of insulin: Secondary | ICD-10-CM

## 2019-08-18 MED ORDER — GLIPIZIDE ER 5 MG PO TB24: 5.0000 mg | ORAL_TABLET | Freq: Every day | ORAL | 3 refills | Status: DC

## 2019-08-18 NOTE — Progress Notes (Signed)
Endocrinology Telehealth Visit 08/18/2019   Follow up Note -During COVID -19 Pandemic  This visit type was conducted due to national recommendations for restrictions regarding the COVID-19 Pandemic  in an effort to limit this patient's exposure and mitigate transmission of the corona virus.  Due to his co-morbid illnesses, George White is at  moderate to high risk for complications without adequate follow up.  This format is felt to be most appropriate for him at this time.  I connected with this patient on 08/18/2019   by telephone and verified that I am speaking with the correct person using two identifiers. George White, [birth year]. he has verbally consented to this visit. All issues noted in this document were discussed and addressed. The format was not optimal for physical exam.     Subjective:    Patient ID: George White, male    DOB: [birth year],    Past Medical History:  Diagnosis Date  . Diabetes mellitus, type II (HCC)   . Hyperlipidemia    Past Surgical History:  Procedure Laterality Date  . ROTATOR CUFF REPAIR     Social History   Socioeconomic History  . Marital status: Married    Spouse name: Not on file  . Number of children: Not on file  . Years of education: Not on file  . Highest education level: Not on file  Occupational History  . Not on file  Social Needs  . Financial resource strain: Not on file  . Food insecurity    Worry: Not on file    Inability: Not on file  . Transportation needs    Medical: Not on file    Non-medical: Not on file  Tobacco Use  . Smoking status: Never Smoker  . Smokeless tobacco: Never Used  Substance and Sexual Activity  . Alcohol use: No    Alcohol/week: 0.0 standard drinks  . Drug use: No  . Sexual activity: Not on file  Lifestyle  . Physical activity    Days per week: Not on file    Minutes per session: Not on file  . Stress: Not on file  Relationships   . Social Musician on phone: Not on file    Gets together: Not on file    Attends religious service: Not on file    Active member of club or organization: Not on file    Attends meetings of clubs or organizations: Not on file    Relationship status: Not on file  Other Topics Concern  . Not on file  Social History Narrative  . Not on file   Outpatient Encounter Medications as of 08/18/2019  Medication Sig  . Exenatide ER 2 MG SRER Inject 2 mg into the skin once a week.  . carvedilol (COREG) 3.125 MG tablet Take 3.125 mg by mouth 2 (two) times daily with a meal.  . Continuous Blood Gluc Sensor (FREESTYLE LIBRE 14 DAY SENSOR) MISC INJECT 1 EACH INTO THE SKIN EVERY 14 DAYS. USE AS DIRECTED.  Marland Kitchen glipiZIDE (GLUCOTROL XL) 5 MG 24 hr tablet Take 1 tablet (5 mg total) by mouth daily with breakfast.  . HUMALOG KWIKPEN 200 UNIT/ML SOPN INJECT 25-31 UNITS  INTO THE SKIN 3 (THREE) TIMES DAILY BEFORE MEALS.  Marland Kitchen. Insulin Degludec (TRESIBA FLEXTOUCH) 200 UNIT/ML SOPN Inject 100 Units into the skin at bedtime.  . Insulin Pen Needle (ULTICARE MICRO PEN NEEDLES) 32G X 4 MM MISC qid  . metFORMIN (GLUCOPHAGE) 1000 MG tablet TAKE 1 TABLET TWICE A DAY  . sildenafil (VIAGRA) 50 MG tablet TAKE 1 TABLET BY MOUTH AS DIRECTED  . simvastatin (ZOCOR) 10 MG tablet Take 1 tablet (10 mg total) by mouth at bedtime.  . [DISCONTINUED] Exenatide ER (BYDUREON) 2 MG PEN Inject 2 mg into the skin once a week.   No facility-administered encounter medications on file as of 08/18/2019.    ALLERGIES: No Known Allergies VACCINATION STATUS:  There is no immunization history on file for this patient.  Diabetes He presents for his follow-up diabetic visit. He has type 2 diabetes mellitus. Onset time: He was diagnosed at approximate age of 29 years. His disease course has been worsening (Since his last visit to he started to register hyperglycemia in the range of 250-300 mg/dL. He was told to return with meter and logs  showing blood glucose  readings 4 times a day.). There are no hypoglycemic associated symptoms. Pertinent negatives for hypoglycemia include no confusion, headaches, pallor or seizures. Associated symptoms include polydipsia and polyuria. Pertinent negatives for diabetes include no chest pain, no fatigue, no polyphagia and no weakness. There are no hypoglycemic complications. Symptoms are worsening. Pertinent negatives for diabetic complications include no impotence. Risk factors for coronary artery disease include diabetes mellitus, dyslipidemia, hypertension, male sex and sedentary lifestyle. Current diabetic treatment includes insulin injections and oral agent (dual therapy) (As well as metformin and Invokana.). He is compliant with treatment most of the time. His weight is fluctuating minimally. He is following a generally unhealthy diet. He participates in exercise intermittently. His home blood glucose trend is increasing steadily. His breakfast blood glucose range is generally >200 mg/dl. His lunch blood glucose range is generally >200 mg/dl. His dinner blood glucose range is generally >200 mg/dl. His bedtime blood glucose range is generally >200 mg/dl. His overall blood glucose range is >200 mg/dl. (He wears a CGM device: 7-day average 171, 14 average 158, 30-day average 180, night average 196.  His previsit labs show A1c of 10.2% improving from 9.6%.) An ACE inhibitor/angiotensin II receptor blocker is not being taken.  Hyperlipidemia This is a chronic problem. The current episode started more than 1 year ago. The problem is uncontrolled. Exacerbating diseases include diabetes and obesity. Pertinent negatives include no chest pain, myalgias or shortness of breath. Current antihyperlipidemic treatment includes statins. Risk factors for coronary artery disease include diabetes mellitus, dyslipidemia, hypertension, a sedentary lifestyle, obesity and male sex.  Hypertension This is a chronic problem. The  current episode started more than 1 year ago. Pertinent negatives include no chest pain, headaches, neck pain, palpitations or shortness of breath. Risk factors for coronary artery disease include dyslipidemia, diabetes mellitus, obesity and sedentary lifestyle.    Review of systems: Limited as above.   Objective:    There were no vitals taken for this visit.  Wt Readings from Last 3 Encounters:  01/12/19 246 lb (111.6 kg)  08/25/18 246 lb (111.6 kg)  04/28/18 242 lb (109.8 kg)    Recent Results (from the past 2160 hour(s))  Basic metabolic panel     Status: Abnormal   Collection Time: 08/03/19 12:00 AM  Result Value Ref Range   BUN 13 4 - 21   Potassium 4.2  3.4 - 5.3   Sodium 136 (A) 137 - 147  Hemoglobin A1c     Status: None   Collection Time: 08/03/19 12:00 AM  Result Value Ref Range   Hemoglobin A1C 10.2   Hepatic function panel     Status: None   Collection Time: 08/03/19 12:00 AM  Result Value Ref Range   ALT 24 10 - 40   AST 17 14 - 40   Bilirubin, Total 0.4    Lipid Panel     Component Value Date/Time   CHOL 120 07/31/2018   TRIG 107 07/31/2018   HDL 25 (A) 07/31/2018   LDLCALC 77 07/31/2018    Assessment & Plan:   1. Uncontrolled type 2 diabetes mellitus with complication, with long-term current use of insulin (HCC)  -He reports significantly above target glycemic profile averaging 196 in the last 90 days, A1c of 10.2% improving from 9.6%.    - He remains at extremely high risk for acute and chronic complications of diabetes which include CAD, CVA, CKD, retinopathy, and neuropathy. These are all discussed in detail with the patient.   Recent labs reviewed, showing normal renal function.  - I have re-counseled the patient on diet management and weight loss  by adopting a carbohydrate restricted / protein rich  Diet.  - he  admits there is a room for improvement in his diet and drink choices. -  Suggestion is made for him to avoid simple carbohydrates   from his diet including Cakes, Sweet Desserts / Pastries, Ice Cream, Soda (diet and regular), Sweet Tea, Candies, Chips, Cookies, Sweet Pastries,  Store Bought Juices, Alcohol in Excess of  1-2 drinks a day, Artificial Sweeteners, Coffee Creamer, and "Sugar-free" Products. This will help patient to have stable blood glucose profile and potentially avoid unintended weight gain.  - Patient is advised to stick to a routine mealtimes to eat 3 meals  a day and avoid unnecessary snacks ( to snack only to correct hypoglycemia).   - I have approached patient with the following individualized plan to manage diabetes and patient agrees.  -He reports significant glycemic burden and A1c is still high at 10.2%.   He will  continue to require intensive treatment with basal/bolus insulin to maintain control of diabetes in target.   -After he finishes his current supplies of Tresiba and Humalog, will be switched to insulin Humulin U500. -He is advised to continue  documenting  blood glucose 4 times a day-before meals and at bedtime.  I encouraged him to use his Freestyle Libre device at all times.   -He is advised to continue Tresiba 100 units nightly, continue Humalog 30  units 3 times a day before meals for blood glucose readings pre-meal above 90 mg/dL (with additional correction for blood glucose readings above 150 mg/dL).  -Patient is encouraged to call clinic for blood glucose levels less than 70 or above 300 mg /dl. -He is advised to continue metformin  1000 mg by mouth twice a day. -He is advised to continue Bydureon 2 mg subcutaneously weekly, he did frankly drop in coverage for his medication, he will stop and get more samples from clinic. -I discussed and added low-dose glipizide 5 mg XL p.o. daily at breakfast.   - Patient specific target  for A1c; LDL, HDL, Triglycerides, and  Waist Circumference were discussed in detail.  2) BP/HTN: he is advised to home monitor blood pressure and report if >  140/90 on 2 separate readings.  He is advised  to continue  his blood pressure medications including carvedilol.   3) Lipids/HPL: His recent lipid panel showed controlled LDL at 77.  He is advised to continue simvastatin 10 mg p.o. nightly.   4)  Weight/Diet: CDE consult in progress, exercise, and carbohydrates information provided.  5) erectile dysfunction: He is benefiting from Viagra therapy.   6) Chronic Care/Health Maintenance: -Patient is  on  medications and encouraged to continue to follow up with Ophthalmology, Podiatrist at least yearly or according to recommendations, and advised to  stay away from smoking. I have recommended yearly flu vaccine and pneumonia vaccination at least every 5 years; moderate intensity exercise for up to 150 minutes weekly; and  sleep for at least 7 hours a day.   - I advised patient to maintain close follow up with his PCP for primary care needs.  - Patient Care Time Today:  40 min, of which >50% was spent in  counseling and the rest reviewing his  current and  previous labs/studies, previous treatments, his blood glucose readings, and medications' doses and developing a plan for long-term care based on the latest recommendations for standards of care.   Lendon Colonel participated in the discussions, expressed understanding, and voiced agreement with the above plans.  All questions were answered to his satisfaction. he is encouraged to contact clinic should he have any questions or concerns prior to his return visit.   Follow up plan: Return in about 3 months (around 11/18/2019), or he will stop by for Bydureon samples, for Bring Meter and Logs- A1c in Office.  Glade Lloyd, MD Phone: [phone]  Fax: [phone]   This note was partially dictated with voice recognition software. Similar sounding words can be transcribed inadequately or may not  be corrected upon review.  08/18/2019, 12:45 PM

## 2019-09-07 ENCOUNTER — Other Ambulatory Visit: Payer: Self-pay | Admitting: "Endocrinology

## 2019-09-24 ENCOUNTER — Other Ambulatory Visit: Payer: Self-pay | Admitting: "Endocrinology

## 2019-11-08 LAB — HEMOGLOBIN A1C: Hemoglobin A1C: 8

## 2019-11-08 LAB — MICROALBUMIN, URINE: Microalb, Ur: 10

## 2019-11-08 LAB — LIPID PANEL
Cholesterol: 95 (ref 0–200)
HDL: 22 — AB (ref 35–70)
LDL Cholesterol: 51
Triglycerides: 133 (ref 40–160)

## 2019-11-08 LAB — BASIC METABOLIC PANEL
BUN: 10 (ref 4–21)
Creatinine: 0.7 (ref 0.6–1.3)

## 2019-11-14 ENCOUNTER — Other Ambulatory Visit: Payer: Self-pay | Admitting: "Endocrinology

## 2019-11-23 ENCOUNTER — Encounter: Payer: Self-pay | Admitting: "Endocrinology

## 2019-11-23 ENCOUNTER — Ambulatory Visit: Payer: BC Managed Care – PPO | Admitting: "Endocrinology

## 2019-11-23 ENCOUNTER — Other Ambulatory Visit: Payer: Self-pay

## 2019-11-23 VITALS — BP 136/84 | HR 86 | Ht 72.0 in | Wt 252.0 lb

## 2019-11-23 DIAGNOSIS — IMO0002 Reserved for concepts with insufficient information to code with codable children: Secondary | ICD-10-CM

## 2019-11-23 DIAGNOSIS — E1165 Type 2 diabetes mellitus with hyperglycemia: Secondary | ICD-10-CM

## 2019-11-23 DIAGNOSIS — I10 Essential (primary) hypertension: Secondary | ICD-10-CM

## 2019-11-23 DIAGNOSIS — E782 Mixed hyperlipidemia: Secondary | ICD-10-CM

## 2019-11-23 DIAGNOSIS — Z794 Long term (current) use of insulin: Secondary | ICD-10-CM

## 2019-11-23 DIAGNOSIS — E118 Type 2 diabetes mellitus with unspecified complications: Secondary | ICD-10-CM

## 2019-11-23 MED ORDER — SILDENAFIL CITRATE 50 MG PO TABS: 50.0000 mg | ORAL_TABLET | ORAL | 2 refills | Status: DC

## 2019-11-23 MED ORDER — SIMVASTATIN 10 MG PO TABS: 10.0000 mg | ORAL_TABLET | Freq: Every day | ORAL | 1 refills | Status: DC

## 2019-11-23 NOTE — Patient Instructions (Signed)

## 2019-11-23 NOTE — Progress Notes (Signed)
Endocrinology Telehealth Visit 11/23/2019   Follow up Note -During COVID -19 Pandemic  This visit type was conducted due to national recommendations for restrictions regarding the COVID-19 Pandemic  in an effort to limit this patient's exposure and mitigate transmission of the corona virus.  Due to his co-morbid illnesses, George White is at  moderate to high risk for complications without adequate follow up.  This format is felt to be most appropriate for him at this time.  I connected with this patient on 11/23/2019   by telephone and verified that I am speaking with the correct person using two identifiers. George White, [birth year]. he has verbally consented to this visit. All issues noted in this document were discussed and addressed. The format was not optimal for physical exam.    Subjective:    Patient ID: George White, male    DOB: [birth year],    Past Medical History:  Diagnosis Date  . Diabetes mellitus, type II (HCC)   . Hyperlipidemia    Past Surgical History:  Procedure Laterality Date  . ROTATOR CUFF REPAIR     Social History   Socioeconomic History  . Marital status: Married    Spouse name: Not on file  . Number of children: Not on file  . Years of education: Not on file  . Highest education level: Not on file  Occupational History  . Not on file  Tobacco Use  . Smoking status: Never Smoker  . Smokeless tobacco: Never Used  Substance and Sexual Activity  . Alcohol use: No    Alcohol/week: 0.0 standard drinks  . Drug use: No  . Sexual activity: Not on file  Other Topics Concern  . Not on file  Social History Narrative  . Not on file   Social Determinants of Health   Financial Resource Strain:   . Difficulty of Paying Living Expenses: Not on file  Food Insecurity:   . Worried About Programme researcher, broadcasting/film/video in the Last Year: Not on file  . Ran Out of Food in the Last Year: Not on file   Transportation Needs:   . Lack of Transportation (Medical): Not on file  . Lack of Transportation (Non-Medical): Not on file  Physical Activity:   . Days of Exercise per Week: Not on file  . Minutes of Exercise per Session: Not on file  Stress:   . Feeling of Stress : Not on file  Social Connections:   . Frequency of Communication with Friends and Family: Not on file  . Frequency of Social Gatherings with Friends and Family: Not on file  . Attends Religious Services: Not on file  . Active Member of Clubs or Organizations: Not on file  . Attends Banker Meetings: Not on file  . Marital Status: Not on file   Outpatient Encounter Medications as of 11/23/2019  Medication Sig  . carvedilol (COREG) 3.125 MG tablet Take 3.125 mg by mouth 2 (two) times daily with a meal.  . Continuous Blood Gluc Sensor (FREESTYLE LIBRE 14 DAY SENSOR) MISC INJECT 1 EACH INTO THE SKIN EVERY 14 DAYS. USE AS DIRECTED.  Marland Kitchen Exenatide ER 2 MG SRER Inject 2 mg into the  skin once a week.  Marland Kitchen glipiZIDE (GLUCOTROL XL) 5 MG 24 hr tablet TAKE 1 TABLET BY MOUTH EVERY DAY WITH BREAKFAST  . HUMALOG KWIKPEN 200 UNIT/ML SOPN INJECT 25-31 UNITS INTO THE SKIN 3 (THREE) TIMES DAILY BEFORE MEALS.  Marland Kitchen Insulin Degludec (TRESIBA FLEXTOUCH) 200 UNIT/ML SOPN Inject 100 Units into the skin at bedtime.  . Insulin Pen Needle (ULTICARE MICRO PEN NEEDLES) 32G X 4 MM MISC qid  . metFORMIN (GLUCOPHAGE) 1000 MG tablet TAKE 1 TABLET TWICE A DAY  . sildenafil (VIAGRA) 50 MG tablet Take 1 tablet (50 mg total) by mouth See admin instructions.  . simvastatin (ZOCOR) 10 MG tablet Take 1 tablet (10 mg total) by mouth at bedtime.  . [DISCONTINUED] sildenafil (VIAGRA) 50 MG tablet TAKE 1 TABLET BY MOUTH AS DIRECTED  . [DISCONTINUED] simvastatin (ZOCOR) 10 MG tablet Take 1 tablet (10 mg total) by mouth at bedtime.   No facility-administered encounter medications on file as of 11/23/2019.   ALLERGIES: No Known Allergies VACCINATION  STATUS:  There is no immunization history on file for this patient.  Diabetes He presents for his follow-up diabetic visit. He has type 2 diabetes mellitus. Onset time: He was diagnosed at approximate age of 29 years. His disease course has been worsening (Since his last visit to he started to register hyperglycemia in the range of 250-300 mg/dL. He was told to return with meter and logs showing blood glucose  readings 4 times a day.). There are no hypoglycemic associated symptoms. Pertinent negatives for hypoglycemia include no confusion, headaches, pallor or seizures. Associated symptoms include polydipsia and polyuria. Pertinent negatives for diabetes include no chest pain, no fatigue, no polyphagia and no weakness. There are no hypoglycemic complications. Symptoms are worsening. Pertinent negatives for diabetic complications include no impotence. Risk factors for coronary artery disease include diabetes mellitus, dyslipidemia, hypertension, male sex and sedentary lifestyle. Current diabetic treatment includes insulin injections and oral agent (dual therapy) (As well as metformin and Invokana.). He is compliant with treatment most of the time. His weight is fluctuating minimally. He is following a generally unhealthy diet. He participates in exercise intermittently. His home blood glucose trend is increasing steadily. His breakfast blood glucose range is generally >200 mg/dl. His lunch blood glucose range is generally >200 mg/dl. His dinner blood glucose range is generally >200 mg/dl. His bedtime blood glucose range is generally >200 mg/dl. His overall blood glucose range is >200 mg/dl. (He wears a CGM device: 7-day average 171, 14 average 158, 30-day average 180, night average 196.  His previsit labs show A1c of 10.2% improving from 9.6%.) An ACE inhibitor/angiotensin II receptor blocker is not being taken.  Hyperlipidemia This is a chronic problem. The current episode started more than 1 year ago. The  problem is uncontrolled. Exacerbating diseases include diabetes and obesity. Pertinent negatives include no chest pain, myalgias or shortness of breath. Current antihyperlipidemic treatment includes statins. Risk factors for coronary artery disease include diabetes mellitus, dyslipidemia, hypertension, a sedentary lifestyle, obesity and male sex.  Hypertension This is a chronic problem. The current episode started more than 1 year ago. Pertinent negatives include no chest pain, headaches, neck pain, palpitations or shortness of breath. Risk factors for coronary artery disease include dyslipidemia, diabetes mellitus, obesity and sedentary lifestyle.    Review of systems:   Constitutional: no weight gain/loss, no fatigue, no subjective hyperthermia, no subjective hypothermia Eyes: no blurry vision, no xerophthalmia ENT: no sore throat, no nodules palpated in throat, no dysphagia/odynophagia, no hoarseness Cardiovascular:  no Chest Pain, no Shortness of Breath, no palpitations, no leg swelling Respiratory: no cough, no SOB Gastrointestinal: no Nausea/Vomiting/Diarhhea Musculoskeletal: no muscle/joint aches Skin: no rashes Neurological: no tremors, no numbness, no tingling, no dizziness Psychiatric: no depression, no anxiety    Objective:    BP 136/84   Pulse 86   Ht 6' (1.829 m)   Wt 252 lb (114.3 kg)   BMI 34.18 kg/m   Wt Readings from Last 3 Encounters:  11/23/19 252 lb (114.3 kg)  01/12/19 246 lb (111.6 kg)  08/25/18 246 lb (111.6 kg)     Physical Exam- Limited  Constitutional:  Body mass index is 34.18 kg/m. , not in acute distress, normal state of mind Eyes:  EOMI, no exophthalmos Neck: Supple Respiratory: Adequate breathing efforts Musculoskeletal: no gross deformities, strength intact in all four extremities, no gross restriction of joint movements Skin:  no rashes, no hyperemia Neurological: no tremor with outstretched hands.  Recent Results (from the past 2160  hour(s))  Hemoglobin A1c     Status: None   Collection Time: 11/08/19 12:00 AM  Result Value Ref Range   Hemoglobin A1C 8.0   Basic metabolic panel     Status: None   Collection Time: 11/08/19 12:00 AM  Result Value Ref Range   BUN 10 4 - 21   Creatinine 0.7 0.6 - 1.3  Lipid panel     Status: Abnormal   Collection Time: 11/08/19 12:00 AM  Result Value Ref Range   Triglycerides 133 40 - 160   Cholesterol 95 0 - 200   HDL 22 (A) 35 - 70   LDL Cholesterol 51   Microalbumin, urine     Status: None   Collection Time: 11/08/19 12:00 AM  Result Value Ref Range   Microalb, Ur 10    Lipid Panel     Component Value Date/Time   CHOL 95 11/08/2019 0000   TRIG 133 11/08/2019 0000   HDL 22 (A) 11/08/2019 0000   LDLCALC 51 11/08/2019 0000    Assessment & Plan:   1. Uncontrolled type 2 diabetes mellitus with complication, with long-term current use of insulin (HCC)  -He returns with his CGM device showing average blood glucose of 172 over the last 90 days.  -His previsit labs are reviewed, including A1c of 8%-improving from 10.2%.       - He remains at extremely high risk for acute and chronic complications of diabetes which include CAD, CVA, CKD, retinopathy, and neuropathy. These are all discussed in detail with the patient.   Recent labs reviewed, showing normal renal function.  - I have re-counseled the patient on diet management and weight loss  by adopting a carbohydrate restricted / protein rich  Diet.  - he  admits there is a room for improvement in his diet and drink choices. -  Suggestion is made for him to avoid simple carbohydrates  from his diet including Cakes, Sweet Desserts / Pastries, Ice Cream, Soda (diet and regular), Sweet Tea, Candies, Chips, Cookies, Sweet Pastries,  Store Bought Juices, Alcohol in Excess of  1-2 drinks a day, Artificial Sweeteners, Coffee Creamer, and "Sugar-free" Products. This will help patient to have stable blood glucose profile and  potentially avoid unintended weight gain.  - Patient is advised to stick to a routine mealtimes to eat 3 meals  a day and avoid unnecessary snacks ( to snack only to correct hypoglycemia).   - I have approached patient with the following individualized plan to manage  diabetes and patient agrees.   He will  continue to require intensive treatment with basal/bolus insulin to maintain control of diabetes in target.   -After he finishes his current supplies of Tresiba and Humalog, will be switched to insulin Humulin U500. -He is advised to continue Tresiba 100 units nightly, continue Humalog 30 units   3 times a day before meals for blood glucose readings pre-meal above 90 mg/dL (with additional correction for blood glucose readings above 150 mg/dL). -He is advised to continue  documenting  blood glucose 4 times a day-before meals and at bedtime.  I encouraged him to use his Freestyle Libre device at all times.   -Patient is encouraged to call clinic for blood glucose levels less than 70 or above 300 mg /dl. -He is advised to continue metformin  1000 mg by mouth twice a day. -He is advised to continue Bydureon 2 mg subcutaneously weekly, his insurance did not provide coverage for this medication.  He is given more samples from clinic.   -He is advised to continue glipizide 5 mg XL p.o. daily at breakfast.   - Patient specific target  for A1c; LDL, HDL, Triglycerides, and  Waist Circumference were discussed in detail.  2) BP/HTN:  His blood pressure is controlled to target.  He is advised to continue  his blood pressure medications including carvedilol.   3) Lipids/HPL: His recent lipid panel showed controlled LDL at 77.  He is advised to continue simvastatin 10 mg p.o. nightly.  Refills made for him.    4)  Weight/Diet: He is gaining weight to a BMI of 33-he is a candidate for weight loss.  I discussed the fact that 5-10% of current body weight loss will impact his diabetes significantly.  CDE  consult in progress, exercise, and carbohydrates information provided.  5) erectile dysfunction: He is benefiting from Viagra therapy.  Refills were sent to his pharmacy.  6) Chronic Care/Health Maintenance: -Patient is  on  medications and encouraged to continue to follow up with Ophthalmology, Podiatrist at least yearly or according to recommendations, and advised to  stay away from smoking. I have recommended yearly flu vaccine and pneumonia vaccination at least every 5 years; moderate intensity exercise for up to 150 minutes weekly; and  sleep for at least 7 hours a day.   - I advised patient to maintain close follow up with his PCP for primary care needs.  - Time spent on this patient care encounter:  35 min, of which > 50% was spent in  counseling and the rest reviewing his blood glucose logs , discussing his hypoglycemia and hyperglycemia episodes, reviewing his current and  previous labs / studies  ( including abstraction from other facilities) and medications  doses and developing a  long term treatment plan and documenting his care.   Please refer to Patient Instructions for Blood Glucose Monitoring and Insulin/Medications Dosing Guide"  in media tab for additional information. Please  also refer to " Patient Self Inventory" in the Media  tab for reviewed elements of pertinent patient history.  George White participated in the discussions, expressed understanding, and voiced agreement with the above plans.  All questions were answered to his satisfaction. he is encouraged to contact clinic should he have any questions or concerns prior to his return visit.   Follow up plan: Return in about 4 months (around 03/22/2020) for Bring Meter and Logs- A1c in Office.  Marquis Lunch, MD Phone: [phone]  Fax: [phone]  This note was partially dictated with voice recognition software. Similar sounding words can be transcribed inadequately or may not  be corrected upon  review.  11/23/2019, 8:56 PM

## 2019-11-24 ENCOUNTER — Other Ambulatory Visit: Payer: Self-pay | Admitting: "Endocrinology

## 2019-11-25 ENCOUNTER — Other Ambulatory Visit: Payer: Self-pay | Admitting: "Endocrinology

## 2019-11-25 MED ORDER — TRESIBA FLEXTOUCH 200 UNIT/ML ~~LOC~~ SOPN: 100.0000 [IU] | PEN_INJECTOR | Freq: Every day | SUBCUTANEOUS | 1 refills | Status: DC

## 2019-11-25 NOTE — Telephone Encounter (Signed)
Noted  

## 2019-11-25 NOTE — Telephone Encounter (Signed)
I will refill one more time so he can finish Humalog as well.

## 2019-11-25 NOTE — Telephone Encounter (Signed)
Received a rx refill for tresiba, at last visit this month pt's note states after he finishes current supply of tresiba and humalog he would be switched to Humilin U500.

## 2020-01-11 ENCOUNTER — Other Ambulatory Visit: Payer: Self-pay | Admitting: "Endocrinology

## 2020-01-24 ENCOUNTER — Telehealth: Payer: Self-pay | Admitting: "Endocrinology

## 2020-01-24 NOTE — Telephone Encounter (Signed)
Pt would like a call regarding his blood sugars

## 2020-01-24 NOTE — Telephone Encounter (Signed)
Pt states he has been under increased stress with his father being ill, states his eating habits have changed. Pt's BG 222, 271 today, 344 yesterday, 228 is his average for the last 7 days, 210 for the last 14 days, 168 for past 30 days and 191 for the past 90 days.

## 2020-01-24 NOTE — Telephone Encounter (Signed)
Advise  him to increase his Evaristo Bury to 120 units nightly, continue Humalog 30 units  3 times a day before meals for blood glucose readings pre-meal above 90 mg/dL (with additional correction for blood glucose readings above 150 mg/dL).  - continue metformin  1000 mg by mouth twice a day, Bydureon 2 mg subcutaneously weekly, and continue glipizide 5 mg XL p.o. daily at breakfast.  He can have a phone visit tomorrow.

## 2020-01-25 NOTE — Telephone Encounter (Signed)
Discussed with pt, he voiced understanding. Offered pt a phone visit this afternoon but he did not feel like it was necessary.

## 2020-01-26 ENCOUNTER — Other Ambulatory Visit: Payer: Self-pay | Admitting: "Endocrinology

## 2020-02-01 ENCOUNTER — Other Ambulatory Visit: Payer: Self-pay | Admitting: "Endocrinology

## 2020-02-23 DIAGNOSIS — I219 Acute myocardial infarction, unspecified: Secondary | ICD-10-CM

## 2020-02-23 HISTORY — DX: Acute myocardial infarction, unspecified: I21.9

## 2020-03-01 ENCOUNTER — Telehealth: Payer: Self-pay | Admitting: "Endocrinology

## 2020-03-01 NOTE — Telephone Encounter (Signed)
Pt wants you to call him. He had a heart attack last week and had 2 stents put in

## 2020-03-01 NOTE — Telephone Encounter (Signed)
Left a message requesting a return call to the office. 

## 2020-03-02 NOTE — Telephone Encounter (Signed)
Discussed with pt, he stated he felt much better, his BP and BG has been better also.

## 2020-03-24 ENCOUNTER — Encounter (HOSPITAL_COMMUNITY): Payer: Self-pay | Admitting: *Deleted

## 2020-03-24 ENCOUNTER — Emergency Department (HOSPITAL_COMMUNITY): Payer: BC Managed Care – PPO

## 2020-03-24 ENCOUNTER — Ambulatory Visit (INDEPENDENT_AMBULATORY_CARE_PROVIDER_SITE_OTHER): Payer: BC Managed Care – PPO | Admitting: "Endocrinology

## 2020-03-24 ENCOUNTER — Emergency Department (HOSPITAL_COMMUNITY)
Admission: EM | Admit: 2020-03-24 | Discharge: 2020-03-24 | Disposition: A | Discharge status: Home / Self Care | Payer: BC Managed Care – PPO | Attending: Emergency Medicine | Admitting: Emergency Medicine

## 2020-03-24 ENCOUNTER — Other Ambulatory Visit: Payer: Self-pay

## 2020-03-24 ENCOUNTER — Encounter: Payer: Self-pay | Admitting: "Endocrinology

## 2020-03-24 VITALS — BP 106/68 | HR 94 | Ht 72.0 in | Wt 243.2 lb

## 2020-03-24 DIAGNOSIS — E119 Type 2 diabetes mellitus without complications: Secondary | ICD-10-CM | POA: Insufficient documentation

## 2020-03-24 DIAGNOSIS — Z20822 Contact with and (suspected) exposure to covid-19: Secondary | ICD-10-CM | POA: Diagnosis not present

## 2020-03-24 DIAGNOSIS — R509 Fever, unspecified: Secondary | ICD-10-CM | POA: Diagnosis not present

## 2020-03-24 DIAGNOSIS — Z955 Presence of coronary angioplasty implant and graft: Secondary | ICD-10-CM | POA: Insufficient documentation

## 2020-03-24 DIAGNOSIS — E6609 Other obesity due to excess calories: Secondary | ICD-10-CM | POA: Diagnosis not present

## 2020-03-24 DIAGNOSIS — R079 Chest pain, unspecified: Secondary | ICD-10-CM | POA: Diagnosis present

## 2020-03-24 DIAGNOSIS — Z794 Long term (current) use of insulin: Secondary | ICD-10-CM | POA: Insufficient documentation

## 2020-03-24 DIAGNOSIS — Z79899 Other long term (current) drug therapy: Secondary | ICD-10-CM | POA: Diagnosis not present

## 2020-03-24 DIAGNOSIS — E1159 Type 2 diabetes mellitus with other circulatory complications: Secondary | ICD-10-CM | POA: Diagnosis not present

## 2020-03-24 DIAGNOSIS — E782 Mixed hyperlipidemia: Secondary | ICD-10-CM

## 2020-03-24 DIAGNOSIS — Z6831 Body mass index (BMI) 31.0-31.9, adult: Secondary | ICD-10-CM

## 2020-03-24 DIAGNOSIS — I10 Essential (primary) hypertension: Secondary | ICD-10-CM | POA: Insufficient documentation

## 2020-03-24 LAB — CBC WITH DIFFERENTIAL/PLATELET
Abs Immature Granulocytes: 0.04 10*3/uL (ref 0.00–0.07)
Basophils Absolute: 0 10*3/uL (ref 0.0–0.1)
Basophils Relative: 0 %
Eosinophils Absolute: 0.1 10*3/uL (ref 0.0–0.5)
Eosinophils Relative: 1 %
HCT: 46.7 % (ref 39.0–52.0)
Hemoglobin: 15 g/dL (ref 13.0–17.0)
Immature Granulocytes: 0 %
Lymphocytes Relative: 5 %
Lymphs Abs: 0.6 10*3/uL — ABNORMAL LOW (ref 0.7–4.0)
MCH: 29.5 pg (ref 26.0–34.0)
MCHC: 32.1 g/dL (ref 30.0–36.0)
MCV: 91.7 fL (ref 80.0–100.0)
Monocytes Absolute: 0.9 10*3/uL (ref 0.1–1.0)
Monocytes Relative: 8 %
Neutro Abs: 9.5 10*3/uL — ABNORMAL HIGH (ref 1.7–7.7)
Neutrophils Relative %: 86 %
Platelets: 223 10*3/uL (ref 150–400)
RBC: 5.09 MIL/uL (ref 4.22–5.81)
RDW: 12.2 % (ref 11.5–15.5)
WBC: 11.1 10*3/uL — ABNORMAL HIGH (ref 4.0–10.5)
nRBC: 0 % (ref 0.0–0.2)

## 2020-03-24 LAB — BASIC METABOLIC PANEL
Anion gap: 10 (ref 5–15)
BUN: 13 mg/dL (ref 6–20)
CO2: 24 mmol/L (ref 22–32)
Calcium: 8.6 mg/dL — ABNORMAL LOW (ref 8.9–10.3)
Chloride: 102 mmol/L (ref 98–111)
Creatinine, Ser: 1.01 mg/dL (ref 0.61–1.24)
GFR calc Af Amer: >60 mL/min (ref >60)
GFR calc non Af Amer: >60 mL/min (ref >60)
Glucose, Bld: 140 mg/dL — ABNORMAL HIGH (ref 70–99)
Potassium: 4 mmol/L (ref 3.5–5.1)
Sodium: 136 mmol/L (ref 135–145)

## 2020-03-24 LAB — URINALYSIS, ROUTINE W REFLEX MICROSCOPIC
Bilirubin Urine: NEGATIVE
Glucose, UA: NEGATIVE mg/dL
Hgb urine dipstick: NEGATIVE
Ketones, ur: NEGATIVE mg/dL
Leukocytes,Ua: NEGATIVE
Nitrite: NEGATIVE
Protein, ur: NEGATIVE mg/dL
Specific Gravity, Urine: 1.025 (ref 1.005–1.030)
pH: 5 (ref 5.0–8.0)

## 2020-03-24 LAB — POCT GLYCOSYLATED HEMOGLOBIN (HGB A1C): Hemoglobin A1C: 8.2 % — AB (ref 4.0–5.6)

## 2020-03-24 LAB — TROPONIN I (HIGH SENSITIVITY)
Troponin I (High Sensitivity): 4 ng/L (ref <18)
Troponin I (High Sensitivity): 4 ng/L (ref <18)

## 2020-03-24 LAB — PROTIME-INR
INR: 1.1 (ref 0.8–1.2)
Prothrombin Time: 13.3 seconds (ref 11.4–15.2)

## 2020-03-24 LAB — SARS CORONAVIRUS 2 BY RT PCR (HOSPITAL ORDER, PERFORMED IN ~~LOC~~ HOSPITAL LAB): SARS Coronavirus 2: NEGATIVE

## 2020-03-24 MED ORDER — HUMULIN R U-500 KWIKPEN 500 UNIT/ML ~~LOC~~ SOPN: 50.0000 [IU] | PEN_INJECTOR | Freq: Three times a day (TID) | SUBCUTANEOUS | 2 refills | Status: DC

## 2020-03-24 MED ORDER — ACETAMINOPHEN 325 MG PO TABS
650.0000 mg | ORAL_TABLET | Freq: Once | ORAL | Status: AC
  Administered 2020-03-24: 650 mg via ORAL
  Filled 2020-03-24: qty 2

## 2020-03-24 NOTE — Discharge Instructions (Signed)
°  Acetaminophen: May take acetaminophen (generic for Tylenol), as needed, for pain and/or fever. Your daily total maximum amount of acetaminophen from all sources should be limited to 4000mg /day for persons without liver problems, or 2000mg /day for those with liver problems.  Follow-up: Recommend follow-up with your primary care provider and your cardiologist.  Return: Return to the emergency department should any symptoms worsen, especially chest pain, shortness of breath, abdominal pain, passing out, or any other major concerns.

## 2020-03-24 NOTE — ED Notes (Signed)
Pt ambulated with steady gait around the front of the nurses station. nad noted. VSS. PA aware and reported pt to be discharged.   Pt discharge instructions reviewed. E-singnature pad not working.

## 2020-03-24 NOTE — ED Provider Notes (Signed)
[facility] EMERGENCY DEPARTMENT Provider Note   CSN: [id number] Arrival date & time: 03/24/20  1054     History Chief Complaint  Patient presents with  . Chest Pain    George White is a 59 y.o. male.  HPI      George White is a 59 y.o. male, with a history of DM, cardiac cath, and hyperlipidemia, presenting to the ED overall feeling unwell. He states he was standing in line at his endocrinologist office after his appointment waiting to check out when he began to have diaphoresis, lightheadedness, nausea, chest pain, and onset of headache.  Headache is right-sided, parietal, pressure, 6/10, nonradiating.  He has had intermittent headaches in this region since his cardiac cath.   Patient sat down and his symptoms resolved except for his chest discomfort and headache.  His chest discomfort is left-sided, feels like a squeezing, currently 4/10, intermittent, episodes lasting 2 minutes or less, nonradiating. He has been experiencing intermittent discomfort in this region since his cardiac cath. His "typical" daily post-cath chest discomfort is worse than the discomfort he is currently experiencing.  When he awoke this morning, he states he felt fatigued and overall unwell.  He had an episode of diarrhea.  Denies fever/chills, vomiting, hematochezia/melena, abdominal pain, shortness of breath, cough, syncope, or any other complaints.   His records are not available in the system, but patient states he underwent cardiac cath February 24, 2020.  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 He states his circumflex was 100% occluded and 2 stents were placed.  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  His cardiologist, Dr. Leonie Green, in [place], told him he thinks this  vessel is probably where his continued chest pain is stemming from.  His blood pressure was too low for his cardiologist to prescribe him nitroglycerin, therefore he was prescribed Ranolazine ER 500 mg twice daily and this has been helping with his pain.        Past Medical History:  Diagnosis Date  . Diabetes mellitus, type II (HCC)   . Hyperlipidemia     Patient Active Problem List   Diagnosis Date Noted  . Class 1 obesity due to excess calories with serious comorbidity and body mass index (BMI) of 31.0 to 31.9 in adult 04/11/2017  . Erectile dysfunction 07/05/2016  . Uncontrolled type 2 diabetes mellitus with complication, with long-term current use of insulin (HCC) 09/14/2015  . Essential hypertension, benign 09/14/2015  . Mixed hyperlipidemia 09/14/2015    Past Surgical History:  Procedure Laterality Date  . ROTATOR CUFF REPAIR         Family History  Problem Relation Age of Onset  . Diabetes Father     Social History   Tobacco Use  . Smoking status: Never Smoker  . Smokeless tobacco: Never Used  Substance Use Topics  . Alcohol use: No    Alcohol/week: 0.0 standard drinks  . Drug use: No    Home Medications Prior to Admission medications   Medication Sig Start Date End Date Taking? Authorizing Provider  aspirin 81 MG EC tablet Take 1 tablet by mouth daily. 12/29/19   [provider]  carvedilol (COREG) 3.125 MG tablet Take 3.125 mg by mouth 2 (two)  times daily with a meal.    [provider]  Continuous Blood Gluc Sensor (FREESTYLE LIBRE 14 DAY SENSOR) MISC INJECT 1 EACH INTO THE SKIN ONCE EVERY 14 DAYS. USE AS DIRECTED. 02/02/20   Roma Kayser, MD  Exenatide ER 2 MG SRER Inject 2 mg into the skin once a week.    [provider]  glipiZIDE (GLUCOTROL XL) 5 MG 24 hr tablet TAKE 1 TABLET BY MOUTH EVERY DAY WITH BREAKFAST 11/15/19   Roma Kayser, MD  Insulin Pen Needle (ULTICARE MICRO PEN NEEDLES) 32G X 4 MM MISC qid  10/22/17   Nida, Denman George, MD  insulin regular human CONCENTRATED (HUMULIN R U-500 KWIKPEN) 500 UNIT/ML kwikpen Inject 50 Units into the skin 3 (three) times daily with meals. 03/24/20   Roma Kayser, MD  metFORMIN (GLUCOPHAGE) 1000 MG tablet TAKE 1 TABLET TWICE A DAY 01/12/20   Roma Kayser, MD  prasugrel (EFFIENT) 10 MG TABS tablet Take 10 mg by mouth daily. 02/25/20   [provider]  ranolazine (RANEXA) 500 MG 12 hr tablet Take 500 mg by mouth 2 (two) times daily. 03/16/20   [provider]  rosuvastatin (CRESTOR) 40 MG tablet Take 40 mg by mouth daily.    [provider]  sildenafil (VIAGRA) 50 MG tablet TAKE 1 TABLET BY MOUTH AS DIRECTED 01/26/20   Roma Kayser, MD    Allergies    Patient has no known allergies.  Review of Systems   Review of Systems  Constitutional: Positive for diaphoresis (resolved). Negative for chills and fever.  Respiratory: Negative for cough and shortness of breath.   Cardiovascular: Positive for chest pain. Negative for leg swelling.  Gastrointestinal: Positive for diarrhea and nausea. Negative for abdominal pain, blood in stool and vomiting.  Neurological: Positive for light-headedness and headaches. Negative for syncope, weakness and numbness.  All other systems reviewed and are negative.   Physical Exam Updated Vital Signs BP 116/70   Pulse 95   Temp (!) 101 F (38.3 C) (Oral)   Resp 18   SpO2 96%   Physical Exam Vitals and nursing note reviewed.  Constitutional:      General: He is not in acute distress.    Appearance: He is well-developed. He is not diaphoretic.  HENT:     Head: Normocephalic and atraumatic.     Mouth/Throat:     Mouth: Mucous membranes are moist.     Pharynx: Oropharynx is clear.  Eyes:     Conjunctiva/sclera: Conjunctivae normal.  Cardiovascular:     Rate and Rhythm: Normal rate and regular rhythm.     Pulses: Normal pulses.          Radial pulses are 2+ on the  right side and 2+ on the left side.       Posterior tibial pulses are 2+ on the right side and 2+ on the left side.     Heart sounds: Normal heart sounds.     Comments: Tactile temperature in the extremities appropriate and equal bilaterally. Pulmonary:     Effort: Pulmonary effort is normal. No respiratory distress.     Breath sounds: Normal breath sounds.  Abdominal:     Palpations: Abdomen is soft.     Tenderness: There is no abdominal tenderness. There is no guarding.  Musculoskeletal:     Cervical back: Neck supple.     Right lower leg: No edema.     Left lower leg: No edema.  Lymphadenopathy:  Cervical: No cervical adenopathy.  Skin:    General: Skin is warm and dry.  Neurological:     Mental Status: He is alert and oriented to person, place, and time.     Comments: No noted acute cognitive deficit. Sensation grossly intact to light touch in the extremities.   Grip strengths equal bilaterally.   Strength 5/5 in all extremities.  No gait disturbance. Coordination intact.  Cranial nerves III-XII grossly intact.  Handles oral secretions without noted difficulty.  No noted phonation or speech deficit. No facial droop.   Psychiatric:        Mood and Affect: Mood and affect normal.        Speech: Speech normal.        Behavior: Behavior normal.     ED Results / Procedures / Treatments   Labs (all labs ordered are listed, but only abnormal results are displayed) Labs Reviewed  BASIC METABOLIC PANEL - Abnormal; Notable for the following components:      Result Value   Glucose, Bld 140 (*)    Calcium 8.6 (*)    All other components within normal limits  CBC WITH DIFFERENTIAL/PLATELET - Abnormal; Notable for the following components:   WBC 11.1 (*)    Neutro Abs 9.5 (*)    Lymphs Abs 0.6 (*)    All other components within normal limits  URINALYSIS, ROUTINE W REFLEX MICROSCOPIC - Abnormal; Notable for the following components:   APPearance HAZY (*)    All other  components within normal limits  SARS CORONAVIRUS 2 BY RT PCR (HOSPITAL ORDER, [place] [facility])  CULTURE, BLOOD (ROUTINE X 2)  CULTURE, BLOOD (ROUTINE X 2)  PROTIME-INR  TROPONIN I (HIGH SENSITIVITY)  TROPONIN I (HIGH SENSITIVITY)    EKG EKG Interpretation  Date/Time:  Friday Mar 24 2020 11:02:45 EDT Ventricular Rate:  98 PR Interval:  158 QRS Duration: 86 QT Interval:  340 QTC Calculation: 434 R Axis:   3 Text Interpretation: Normal sinus rhythm T wave abnormality, consider inferior ischemia Abnormal ECG Confirmed by Elnora Morrison [phone]) on 03/24/2020 12:41:05 PM Also confirmed by Elnora Morrison [phone])  on 03/24/2020 3:19:44 PM   Radiology DG Chest 2 View  Result Date: 03/24/2020 CLINICAL DATA:  Chest pain today. Fatigue for the past few days. EXAM: CHEST - 2 VIEW COMPARISON:  None. FINDINGS: Normal sized heart. Mildly tortuous aorta. Clear lungs with normal vascularity. Mild peribronchial thickening. Unremarkable bones. IMPRESSION: Mild bronchitic changes. Electronically Signed   By: Claudie Revering M.D.   On: 03/24/2020 13:29    Cardiac cath performed 02/24/20:        Procedures Procedures (including critical care time)  Medications Ordered in ED Medications  acetaminophen (TYLENOL) tablet 650 mg (650 mg Oral Given 03/24/20 1357)    ED Course  I have reviewed the triage vital signs and the nursing notes.  Pertinent labs & imaging results that were available during my care of the patient were reviewed by me and considered in my medical decision making (see chart for details).  Clinical Course as of Mar 25 2211  Fri Mar 24, 2020  1247 Tried to call office of patient's cardiologist, Dr. Gibson Ramp. No answer.   [SJ]  1255 With patient's permission, asked unit secretary to obtain records from [facility].   [SJ]  1445 Patient states his headache has resolved.  He denies any persistent chest pain at this time.   [SJ]  1607 Again tried to call  office of  patient's cardiologist, Dr. Leonie Green. No answer.   [SJ]  1539 Spoke with Dr. Purvis Sheffield, cardiologist. We discussed the patient's symptoms, presentation, vital signs.  He reviewed the cardiac cath report and EKG. He states the patient can go home and follow-up in the office.   [SJ]    Clinical Course User Index [SJ] Zorian Gunderman, Hillard Danker, PA-C   MDM Rules/Calculators/A&P                      Patient presents with an episode of sweating, lightheadedness, chest pain. He mentions the instances of chest pain he has experienced today are not as severe as the pain he has been experiencing throughout the week since his stent placement. Patient is nontoxic appearing, not tachycardic, not tachypneic, not hypotensive, maintains excellent SPO2 on room air, and is in no apparent distress.  He did have a fever with source unknown at this time.  Blood cultures pending.  I have reviewed the patient's chart to obtain more information.   I reviewed and interpreted the patient's labs and radiological studies. Radiologist notes what appears to be bronchitic changes on chest x-ray, however, patient has had no cough, shortness of breath, etc.  Low suspicion for ACS. EKG without evidence of acute ischemia or pathologic/symptomatic arrhythmia. Delta troponins negative. Wells criteria score is 0, indicating low risk for PE.   Dissection was considered, but thought less likely base on: History and description of the pain are not suggestive, patient is not ill-appearing, lack of risk factors, equal bilateral pulses, lack of neurologic deficits, no widened mediastinum on chest x-ray.  Patient complaint free at time of discharge.  Ambulated without noted difficulty or onset of any complaints. Tolerating PO at time of discharge.  The patient was given instructions for home care as well as return precautions. Patient voices understanding of these instructions, accepts the plan, and is comfortable with  discharge.   Findings and plan of care discussed with Cephus Richer, MD.   Vitals:   03/24/20 1230 03/24/20 1300 03/24/20 1345 03/24/20 1346  BP: 119/62 116/70    Pulse: 96 95    Resp:    18  Temp:   (!) 101 F (38.3 C)   TempSrc:   Oral   SpO2: 96% 96%     Vitals:   03/24/20 1500 03/24/20 1553 03/24/20 1600 03/24/20 1615  BP: 124/72  106/64   Pulse: 93   91  Resp:   18   Temp:  99.5 F (37.5 C)    TempSrc:  Oral    SpO2: 97%   95%      Final Clinical Impression(s) / ED Diagnoses Final diagnoses:  Fever, unspecified fever cause    Rx / DC Orders ED Discharge Orders    None       Concepcion Living 03/24/20 2217    Blane Ohara, MD 04/02/20 [phone]

## 2020-03-24 NOTE — Progress Notes (Signed)
Endocrinology Telehealth Visit 03/24/2020  Endocrinology follow-up note   Subjective:    Patient ID: George White, male    DOB: 1960/12/29,    Past Medical History:  Diagnosis Date  . Diabetes mellitus, type II (HCC)   . Hyperlipidemia    Past Surgical History:  Procedure Laterality Date  . ROTATOR CUFF REPAIR     Social History   Socioeconomic History  . Marital status: Married    Spouse name: Not on file  . Number of children: Not on file  . Years of education: Not on file  . Highest education level: Not on file  Occupational History  . Not on file  Tobacco Use  . Smoking status: Never Smoker  . Smokeless tobacco: Never Used  Substance and Sexual Activity  . Alcohol use: No    Alcohol/week: 0.0 standard drinks  . Drug use: No  . Sexual activity: Not on file  Other Topics Concern  . Not on file  Social History Narrative  . Not on file   Social Determinants of Health   Financial Resource Strain:   . Difficulty of Paying Living Expenses:   Food Insecurity:   . Worried About Programme researcher, broadcasting/film/video in the Last Year:   . Barista in the Last Year:   Transportation Needs:   . Freight forwarder (Medical):   Marland Kitchen Lack of Transportation (Non-Medical):   Physical Activity:   . Days of Exercise per Week:   . Minutes of Exercise per Session:   Stress:   . Feeling of Stress :   Social Connections:   . Frequency of Communication with Friends and Family:   . Frequency of Social Gatherings with Friends and Family:   . Attends Religious Services:   . Active Member of Clubs or Organizations:   . Attends Banker Meetings:   Marland Kitchen Marital Status:    Outpatient Encounter Medications as of 03/24/2020  Medication Sig  . aspirin 81 MG EC tablet Take 1 tablet by mouth daily.  . rosuvastatin (CRESTOR) 40 MG tablet Take 40 mg by mouth daily.  . carvedilol (COREG) 3.125 MG tablet Take 3.125 mg by mouth 2  (two) times daily with a meal.  . Continuous Blood Gluc Sensor (FREESTYLE LIBRE 14 DAY SENSOR) MISC INJECT 1 EACH INTO THE SKIN ONCE EVERY 14 DAYS. USE AS DIRECTED.  Marland Kitchen Exenatide ER 2 MG SRER Inject 2 mg into the skin once a week.  Marland Kitchen glipiZIDE (GLUCOTROL XL) 5 MG 24 hr tablet TAKE 1 TABLET BY MOUTH EVERY DAY WITH BREAKFAST  . Insulin Pen Needle (ULTICARE MICRO PEN NEEDLES) 32G X 4 MM MISC qid  . insulin regular human CONCENTRATED (HUMULIN R U-500 KWIKPEN) 500 UNIT/ML kwikpen Inject 50 Units into the skin 3 (three) times daily with meals.  . metFORMIN (GLUCOPHAGE) 1000 MG tablet TAKE 1 TABLET TWICE A DAY  . prasugrel (EFFIENT) 10 MG TABS tablet Take 10 mg by mouth daily.  . ranolazine (RANEXA) 500 MG 12 hr tablet Take 500 mg by mouth 2 (two) times daily.  . sildenafil (VIAGRA) 50 MG tablet TAKE 1 TABLET BY MOUTH AS DIRECTED  . [DISCONTINUED] HUMALOG KWIKPEN 200 UNIT/ML  KwikPen INJECT SUBCUTANEOUSLY 30-36UNITS 3 TIMES A DAY BEFORE MEALS  . [DISCONTINUED] Insulin Degludec (TRESIBA FLEXTOUCH) 200 UNIT/ML SOPN Inject 100 Units into the skin at bedtime.  . [DISCONTINUED] simvastatin (ZOCOR) 10 MG tablet Take 1 tablet (10 mg total) by mouth at bedtime.  . [DISCONTINUED] TRESIBA FLEXTOUCH 200 UNIT/ML SOPN INJECT 80 UNITS            SUBCUTANEOUSLY AT BEDTIME   No facility-administered encounter medications on file as of 03/24/2020.   ALLERGIES: No Known Allergies VACCINATION STATUS:  There is no immunization history on file for this patient.  Diabetes He presents for his follow-up diabetic visit. He has type 2 diabetes mellitus. Onset time: He was diagnosed at approximate age of 52 years. His disease course has been improving (Since his last visit he did have coronary artery disease which required 2 stent placements.). There are no hypoglycemic associated symptoms. Pertinent negatives for hypoglycemia include no confusion, headaches, pallor or seizures. Associated symptoms include polydipsia and  polyuria. Pertinent negatives for diabetes include no chest pain, no fatigue, no polyphagia and no weakness. There are no hypoglycemic complications. Symptoms are improving. Diabetic complications include heart disease and impotence. Risk factors for coronary artery disease include diabetes mellitus, dyslipidemia, hypertension, male sex, sedentary lifestyle and obesity. Current diabetic treatment includes insulin injections and oral agent (dual therapy) (As well as metformin and Invokana.). He is compliant with treatment most of the time. His weight is fluctuating minimally. He is following a generally unhealthy diet. When asked about meal planning, he reported none. He participates in exercise intermittently. His home blood glucose trend is fluctuating minimally. His breakfast blood glucose range is generally 140-180 mg/dl. His lunch blood glucose range is generally 140-180 mg/dl. His dinner blood glucose range is generally 140-180 mg/dl. His bedtime blood glucose range is generally 140-180 mg/dl. His overall blood glucose range is 140-180 mg/dl. (He wears a CGM device: His his discharge from hospital after coronary artery disease, she registered a much better glycemic profile.  40-day average 130, 30-day average 133, 90-day average 168.  -His point-of-care A1c is 8.2%, overall improving from 10.2%. ) An ACE inhibitor/angiotensin II receptor blocker is not being taken.  Hyperlipidemia This is a chronic problem. The current episode started more than 1 year ago. The problem is uncontrolled. Exacerbating diseases include diabetes and obesity. Pertinent negatives include no chest pain, myalgias or shortness of breath. Current antihyperlipidemic treatment includes statins. Risk factors for coronary artery disease include diabetes mellitus, dyslipidemia, hypertension, a sedentary lifestyle, obesity and male sex.  Hypertension This is a chronic problem. The current episode started more than 1 year ago. Pertinent  negatives include no chest pain, headaches, neck pain, palpitations or shortness of breath. Risk factors for coronary artery disease include dyslipidemia, diabetes mellitus, obesity and sedentary lifestyle.    Review of systems  Constitutional: + Minimally fluctuating body weight,  current  Body mass index is 32.98 kg/m. , no fatigue, no subjective hyperthermia, no subjective hypothermia Eyes: no blurry vision, no xerophthalmia ENT: no sore throat, no nodules palpated in throat, no dysphagia/odynophagia, no hoarseness Cardiovascular: no Chest Pain, no Shortness of Breath, no palpitations, no leg swelling Respiratory: no cough, no shortness of breath Gastrointestinal: no Nausea/Vomiting/Diarhhea Musculoskeletal: no muscle/joint aches Skin: no rashes, no hyperemia Neurological: no tremors, no numbness, no tingling, no dizziness Psychiatric: no depression, no anxiety   Objective:    BP 106/68   Pulse 94   Ht 6' (1.829 m)   Wt 243 lb 3.2 oz (  110.3 kg)   BMI 32.98 kg/m   Wt Readings from Last 3 Encounters:  03/24/20 243 lb 3.2 oz (110.3 kg)  11/23/19 252 lb (114.3 kg)  01/12/19 246 lb (111.6 kg)      Physical Exam- Limited  Constitutional:  Body mass index is 32.98 kg/m. , not in acute distress, normal state of mind Eyes:  EOMI, no exophthalmos Neck: Supple Thyroid: No gross goiter Respiratory: Adequate breathing efforts Musculoskeletal: no gross deformities, strength intact in all four extremities, no gross restriction of joint movements Skin:  no rashes, no hyperemia Neurological: no tremor with outstretched hands,    Recent Results (from the past 2160 hour(s))  HgB A1c     Status: Abnormal   Collection Time: 03/24/20 10:13 AM  Result Value Ref Range   Hemoglobin A1C 8.2 (A) 4.0 - 5.6 %   HbA1c POC (<> result, manual entry)     HbA1c, POC (prediabetic range)     HbA1c, POC (controlled diabetic range)    Basic metabolic panel     Status: Abnormal   Collection Time:  03/24/20 12:07 PM  Result Value Ref Range   Sodium 136 135 - 145 mmol/L   Potassium 4.0 3.5 - 5.1 mmol/L   Chloride 102 98 - 111 mmol/L   CO2 24 22 - 32 mmol/L   Glucose, Bld 140 (H) 70 - 99 mg/dL    Comment: Glucose reference range applies only to samples taken after fasting for at least 8 hours.   BUN 13 6 - 20 mg/dL   Creatinine, Ser 9.40 0.61 - 1.24 mg/dL   Calcium 8.6 (L) 8.9 - 10.3 mg/dL   GFR calc non Af Amer >60 >60 mL/min   GFR calc Af Amer >60 >60 mL/min   Anion gap 10 5 - 15    Comment: Performed at [facility], [address], [place], [place] [address]  Troponin I (High Sensitivity)     Status: None   Collection Time: 03/24/20 12:07 PM  Result Value Ref Range   Troponin I (High Sensitivity) 4 <18 ng/L    Comment: (NOTE) Elevated high sensitivity troponin I (hsTnI) values and significant  changes across serial measurements may suggest ACS but many other  chronic and acute conditions are known to elevate hsTnI results.  Refer to the "Links" section for chest pain algorithms and additional  guidance. Performed at [facility], [address] [place], [place] [address]   Protime-INR (order if Patient is taking Coumadin / Warfarin)     Status: None   Collection Time: 03/24/20 12:07 PM  Result Value Ref Range   Prothrombin Time 13.3 11.4 - 15.2 seconds   INR 1.1 0.8 - 1.2    Comment: (NOTE) INR goal varies based on device and disease states. Performed at [facility], [address], [place], [place] [address]   CBC with Differential     Status: Abnormal   Collection Time: 03/24/20 12:15 PM  Result Value Ref Range   WBC 11.1 (H) 4.0 - 10.5 K/uL   RBC 5.09 4.22 - 5.81 MIL/uL   Hemoglobin 15.0 13.0 - 17.0 g/dL   HCT 85.9 29.2 - 44.6 %   MCV 91.7 80.0 - 100.0 fL   MCH 29.5 26.0 - 34.0 pg   MCHC 32.1 30.0 - 36.0 g/dL   RDW 28.6 38.1 - 77.1 %   Platelets 223 150 - 400 K/uL   nRBC 0.0 0.0 - 0.2 %   Neutrophils Relative % 86 %  Neutro Abs 9.5 (H) 1.7 - 7.7 K/uL    Lymphocytes Relative 5 %   Lymphs Abs 0.6 (L) 0.7 - 4.0 K/uL   Monocytes Relative 8 %   Monocytes Absolute 0.9 0.1 - 1.0 K/uL   Eosinophils Relative 1 %   Eosinophils Absolute 0.1 0.0 - 0.5 K/uL   Basophils Relative 0 %   Basophils Absolute 0.0 0.0 - 0.1 K/uL   Immature Granulocytes 0 %   Abs Immature Granulocytes 0.04 0.00 - 0.07 K/uL    Comment: Performed at [facility], [address] [place], [place] [address]  Urinalysis, Routine w reflex microscopic     Status: Abnormal   Collection Time: 03/24/20 12:49 PM  Result Value Ref Range   Color, Urine YELLOW YELLOW   APPearance HAZY (A) CLEAR   Specific Gravity, Urine 1.025 1.005 - 1.030   pH 5.0 5.0 - 8.0   Glucose, UA NEGATIVE NEGATIVE mg/dL   Hgb urine dipstick NEGATIVE NEGATIVE   Bilirubin Urine NEGATIVE NEGATIVE   Ketones, ur NEGATIVE NEGATIVE mg/dL   Protein, ur NEGATIVE NEGATIVE mg/dL   Nitrite NEGATIVE NEGATIVE   Leukocytes,Ua NEGATIVE NEGATIVE    Comment: Performed at [facility], [address], [place], [place] [address]   Lipid Panel     Component Value Date/Time   CHOL 95 11/08/2019 0000   TRIG 133 11/08/2019 0000   HDL 22 (A) 11/08/2019 0000   LDLCALC 51 11/08/2019 0000    Assessment & Plan:   1. Uncontrolled type 2 diabetes mellitus with complications including coronary artery disease: He wears a CGM device: His his discharge from hospital after coronary artery disease, she registered a much better glycemic profile.  40-day average 130, 30-day average 133, 90-day average 168.  -His point-of-care A1c is 8.2%, overall improving from 10.2%.  -He recently developed coronary disease which required stent placement x2, he remains at extremely high risk for acute and chronic complications of diabetes which include CAD, CVA, CKD, retinopathy, and neuropathy. These are all discussed in detail with the patient.   Recent labs reviewed, showing normal renal function.  - I have re-counseled the patient on diet  management and weight loss  by adopting a carbohydrate restricted / protein rich  Diet.  - he  admits there is a room for improvement in his diet and drink choices. -  Suggestion is made for him to avoid simple carbohydrates  from his diet including Cakes, Sweet Desserts / Pastries, Ice Cream, Soda (diet and regular), Sweet Tea, Candies, Chips, Cookies, Sweet Pastries,  Store Bought Juices, Alcohol in Excess of  1-2 drinks a day, Artificial Sweeteners, Coffee Creamer, and "Sugar-free" Products. This will help patient to have stable blood glucose profile and potentially avoid unintended weight gain.  - Patient is advised to stick to a routine mealtimes to eat 3 meals  a day and avoid unnecessary snacks ( to snack only to correct hypoglycemia).   - I have approached patient with the following individualized plan to manage diabetes and patient agrees.   He will  continue to require intensive treatment with basal/bolus insulin to maintain control of diabetes in target.   -After he finishes his current supplies of Tresiba and Humalog, will be switched to insulin Humulin U500. -He is advised to continue Tresiba 100 units nightly, lower Humalog to 20  units   3 times a day before meals for blood glucose readings pre-meal above 90 mg/dL (with additional correction for blood glucose readings above 150 mg/dL).  He  is given samples of Fiasp from clinic to use instead of Humalog to finish the [place] with. -He is advised to continue  documenting  blood glucose 4 times a day-before meals and at bedtime.  I encouraged him to use his Freestyle Libre device at all times.   -After he uses up his [place] and Humalog, he will continue with insulin U500 50 units 3 times daily AC for Premeal blood glucose readings above 90 mg per DL.  -Patient is encouraged to call clinic for blood glucose levels less than 70 or above 200 mg /dl. -He is advised to continue metformin  1000 mg by mouth twice a day. -He is advised to  continue Bydureon 2 mg subcutaneously weekly, his insurance did not provide coverage for this medication.  -He is advised to continue glipizide 5 mg XL p.o. daily at breakfast.   - Patient specific target  for A1c; LDL, HDL, Triglycerides, and  Waist Circumference were discussed in detail.  2) BP/HTN:  -Blood pressure is controlled to target.  He is advised to continue  his blood pressure medications including carvedilol.   3) Lipids/HPL: His recent lipid panel showed controlled LDL at 77.  He is advised to continue Crestor 40 mg p.o. nightly.    4)  Weight/Diet: He is gaining weight to a BMI of 32.98--he is a candidate for weight loss.  I discussed the fact that 5-10% of current body weight loss will impact his diabetes significantly.  CDE consult in progress, exercise, and carbohydrates information provided.  5) erectile dysfunction: He is benefiting from Viagra therapy.  6) Chronic Care/Health Maintenance: -Patient is  on  medications and encouraged to continue to follow up with Ophthalmology, Podiatrist at least yearly or according to recommendations, and advised to  stay away from smoking. I have recommended yearly flu vaccine and pneumonia vaccination at least every 5 years; moderate intensity exercise for up to 150 minutes weekly; and  sleep for at least 7 hours a day.   - I advised patient to maintain close follow up with his PCP for primary care needs.  - Time spent on this patient care encounter:  40 min, of which > 50% was spent in  counseling and the rest reviewing his blood glucose logs , discussing his hypoglycemia and hyperglycemia episodes, reviewing his current and  previous labs / studies  ( including abstraction from other facilities) and medications  doses and developing a  long term treatment plan and documenting his care.   Please refer to Patient Instructions for Blood Glucose Monitoring and Insulin/Medications Dosing Guide"  in media tab for additional information. Please   also refer to " Patient Self Inventory" in the Media  tab for reviewed elements of pertinent patient history.  [name] participated in the discussions, expressed understanding, and voiced agreement with the above plans.  All questions were answered to his satisfaction. he is encouraged to contact clinic should he have any questions or concerns prior to his return visit.  Follow up plan: Return in about 3 months (around 06/24/2020) for F/U with Pre-visit Labs, Meter, Logs, A1c here., NV A1c in Office, NV Office Urine MA.  Marquis Lunch, MD Phone: [phone]  Fax: [phone]   This note was partially dictated with voice recognition software. Similar sounding words can be transcribed inadequately or may not  be corrected upon review.  03/24/2020, 1:36 PM

## 2020-03-24 NOTE — ED Triage Notes (Signed)
Chest pain onset this morning about 30 minutes ago, stent placement 1 month ago

## 2020-03-24 NOTE — Patient Instructions (Signed)

## 2020-03-29 LAB — CULTURE, BLOOD (ROUTINE X 2)
Culture: NO GROWTH
Culture: NO GROWTH
Special Requests: ADEQUATE

## 2020-04-04 ENCOUNTER — Telehealth: Payer: Self-pay | Admitting: "Endocrinology

## 2020-04-04 NOTE — Telephone Encounter (Signed)
Discussed with pt we have sent a prior authorization request in to his insurance and are awaiting determination, understanding voiced.

## 2020-04-04 NOTE — Telephone Encounter (Signed)
Patient called and states that the pharmacy is telling him the below medication is needing a prior authorization before he can get this filled.  insulin regular human CONCENTRATED (HUMULIN R U-500 KWIKPEN) 500 UNIT/ML kwikpen   [id number] - [place], [place] - [address] [phone] (Phone) [phone] (Fax)

## 2020-04-13 NOTE — Telephone Encounter (Signed)
Pt said he would like you to call him, he still can not get his Humalog.

## 2020-04-13 NOTE — Telephone Encounter (Signed)
We are waiting for a determination on an appeals for pt's Humulin R U-500 but he is running low of the humalog he has been taking. States the insurance company told him it could take up to 19 days for determination but they were going to try to expedite it asap. Requesting a refill for humalog to cover while we wait on determination

## 2020-04-13 NOTE — Telephone Encounter (Signed)
He can get samples of rapid acting insulin options from clinic to cover any gap.

## 2020-04-13 NOTE — Telephone Encounter (Signed)
Made pt aware, he is coming by the office tomorrow morning for samples.

## 2020-04-14 ENCOUNTER — Emergency Department (HOSPITAL_COMMUNITY)
Admission: EM | Admit: 2020-04-14 | Discharge: 2020-04-14 | Disposition: A | Discharge status: Home / Self Care | Payer: BC Managed Care – PPO | Attending: Emergency Medicine | Admitting: Emergency Medicine

## 2020-04-14 ENCOUNTER — Other Ambulatory Visit: Payer: Self-pay

## 2020-04-14 ENCOUNTER — Encounter (HOSPITAL_COMMUNITY): Payer: Self-pay | Admitting: *Deleted

## 2020-04-14 ENCOUNTER — Emergency Department (HOSPITAL_COMMUNITY): Payer: BC Managed Care – PPO

## 2020-04-14 DIAGNOSIS — Z7982 Long term (current) use of aspirin: Secondary | ICD-10-CM | POA: Insufficient documentation

## 2020-04-14 DIAGNOSIS — I10 Essential (primary) hypertension: Secondary | ICD-10-CM | POA: Diagnosis not present

## 2020-04-14 DIAGNOSIS — R109 Unspecified abdominal pain: Secondary | ICD-10-CM | POA: Diagnosis present

## 2020-04-14 DIAGNOSIS — R197 Diarrhea, unspecified: Secondary | ICD-10-CM

## 2020-04-14 DIAGNOSIS — E279 Disorder of adrenal gland, unspecified: Secondary | ICD-10-CM | POA: Insufficient documentation

## 2020-04-14 DIAGNOSIS — Z79899 Other long term (current) drug therapy: Secondary | ICD-10-CM | POA: Insufficient documentation

## 2020-04-14 DIAGNOSIS — E119 Type 2 diabetes mellitus without complications: Secondary | ICD-10-CM | POA: Diagnosis not present

## 2020-04-14 DIAGNOSIS — R911 Solitary pulmonary nodule: Secondary | ICD-10-CM | POA: Diagnosis not present

## 2020-04-14 DIAGNOSIS — Z794 Long term (current) use of insulin: Secondary | ICD-10-CM | POA: Diagnosis not present

## 2020-04-14 DIAGNOSIS — E278 Other specified disorders of adrenal gland: Secondary | ICD-10-CM

## 2020-04-14 LAB — BASIC METABOLIC PANEL
Anion gap: 10 (ref 5–15)
BUN: 12 mg/dL (ref 6–20)
CO2: 24 mmol/L (ref 22–32)
Calcium: 8.4 mg/dL — ABNORMAL LOW (ref 8.9–10.3)
Chloride: 103 mmol/L (ref 98–111)
Creatinine, Ser: 0.82 mg/dL (ref 0.61–1.24)
GFR calc Af Amer: >60 mL/min (ref >60)
GFR calc non Af Amer: >60 mL/min (ref >60)
Glucose, Bld: 154 mg/dL — ABNORMAL HIGH (ref 70–99)
Potassium: 3.6 mmol/L (ref 3.5–5.1)
Sodium: 137 mmol/L (ref 135–145)

## 2020-04-14 LAB — CBC WITH DIFFERENTIAL/PLATELET
Abs Immature Granulocytes: 0.04 10*3/uL (ref 0.00–0.07)
Basophils Absolute: 0 10*3/uL (ref 0.0–0.1)
Basophils Relative: 0 %
Eosinophils Absolute: 0.2 10*3/uL (ref 0.0–0.5)
Eosinophils Relative: 2 %
HCT: 44.3 % (ref 39.0–52.0)
Hemoglobin: 14.4 g/dL (ref 13.0–17.0)
Immature Granulocytes: 0 %
Lymphocytes Relative: 15 %
Lymphs Abs: 1.8 10*3/uL (ref 0.7–4.0)
MCH: 29.6 pg (ref 26.0–34.0)
MCHC: 32.5 g/dL (ref 30.0–36.0)
MCV: 91.2 fL (ref 80.0–100.0)
Monocytes Absolute: 1.1 10*3/uL — ABNORMAL HIGH (ref 0.1–1.0)
Monocytes Relative: 9 %
Neutro Abs: 8.8 10*3/uL — ABNORMAL HIGH (ref 1.7–7.7)
Neutrophils Relative %: 74 %
Platelets: 282 10*3/uL (ref 150–400)
RBC: 4.86 MIL/uL (ref 4.22–5.81)
RDW: 12.8 % (ref 11.5–15.5)
WBC: 12 10*3/uL — ABNORMAL HIGH (ref 4.0–10.5)
nRBC: 0 % (ref 0.0–0.2)

## 2020-04-14 LAB — URINALYSIS, ROUTINE W REFLEX MICROSCOPIC
Bilirubin Urine: NEGATIVE
Glucose, UA: NEGATIVE mg/dL
Hgb urine dipstick: NEGATIVE
Ketones, ur: NEGATIVE mg/dL
Leukocytes,Ua: NEGATIVE
Nitrite: NEGATIVE
Protein, ur: NEGATIVE mg/dL
Specific Gravity, Urine: 1.024 (ref 1.005–1.030)
pH: 5 (ref 5.0–8.0)

## 2020-04-14 LAB — HEPATIC FUNCTION PANEL
ALT: 92 U/L — ABNORMAL HIGH (ref 0–44)
AST: 60 U/L — ABNORMAL HIGH (ref 15–41)
Albumin: 3.9 g/dL (ref 3.5–5.0)
Alkaline Phosphatase: 75 U/L (ref 38–126)
Bilirubin, Direct: 0.1 mg/dL (ref 0.0–0.2)
Indirect Bilirubin: 0.4 mg/dL (ref 0.3–0.9)
Total Bilirubin: 0.5 mg/dL (ref 0.3–1.2)
Total Protein: 7.4 g/dL (ref 6.5–8.1)

## 2020-04-14 LAB — LIPASE, BLOOD: Lipase: 53 U/L — ABNORMAL HIGH (ref 11–51)

## 2020-04-14 MED ORDER — HYDROCODONE-ACETAMINOPHEN 5-325 MG PO TABS: ORAL_TABLET | ORAL | 0 refills | Status: DC

## 2020-04-14 MED ORDER — MORPHINE SULFATE (PF) 2 MG/ML IV SOLN
2.0000 mg | Freq: Once | INTRAVENOUS | Status: AC
  Administered 2020-04-14: 2 mg via INTRAVENOUS
  Filled 2020-04-14: qty 1

## 2020-04-14 MED ORDER — SODIUM CHLORIDE 0.9 % IV BOLUS
1000.0000 mL | Freq: Once | INTRAVENOUS | Status: AC
  Administered 2020-04-14: 1000 mL via INTRAVENOUS

## 2020-04-14 MED ORDER — ONDANSETRON HCL 4 MG/2ML IJ SOLN
4.0000 mg | Freq: Once | INTRAMUSCULAR | Status: AC
  Administered 2020-04-14: 4 mg via INTRAVENOUS
  Filled 2020-04-14: qty 2

## 2020-04-14 MED ORDER — LOPERAMIDE HCL 1 MG/5ML PO LIQD: 2.0000 mg | ORAL | 0 refills | Status: DC | PRN

## 2020-04-14 MED ORDER — DICYCLOMINE HCL 20 MG PO TABS
20.0000 mg | ORAL_TABLET | Freq: Two times a day (BID) | ORAL | 0 refills | Status: DC
Start: 2020-04-14 — End: 2020-04-26

## 2020-04-14 MED ORDER — IOHEXOL 300 MG/ML  SOLN
100.0000 mL | Freq: Once | INTRAMUSCULAR | Status: AC | PRN
  Administered 2020-04-14: 100 mL via INTRAVENOUS

## 2020-04-14 NOTE — ED Triage Notes (Signed)
Diarrhea onset 03/23/20. C/o dizziness, weight loss and weakness

## 2020-04-14 NOTE — Discharge Instructions (Addendum)
You may call one of the gastroenterologist listed on Monday to arrange a follow-up appointment.  Try taking the Imodium as directed to help with diarrhea.  You may find a BRAT may also help slow up your diarrhea.  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  This will need to be further evaluated by your primary care provider.  Return to the emergency department if you develop any worsening symptoms such as increasing abdominal pain, fever, or vomiting.

## 2020-04-14 NOTE — ED Provider Notes (Signed)
[facility] EMERGENCY DEPARTMENT Provider Note   CSN: [id number] Arrival date & time: 04/14/20  1200     History Chief Complaint  Patient presents with  . Diarrhea  . Dizziness    George White is a 59 y.o. male.  HPI      George White is a 59 y.o. male with hx of DM and HTN, who presents to the Emergency Department complaining of left-sided abdominal pain, diarrhea, dizziness and generalized weakness.  Symptoms have been present since 03/23/2020.  He was seen here on 5/28 for similar symptoms.  He has since seen his PCP and had negative C Diff and stool samples.  He reports multiple episodes fo watery diarrhea that he describes as light colored.  No bloody or black stools.  He reports generalized weakness and intermittent dizziness with standing.  He describes having a fullness and bloating of his abdomen as well.  He denies chest pain, shortness of breath, nausea and vomiting.  He had a 15 pound weight loss since symptoms began.  Attempted to see GI but unable to get appt until July     Past Medical History:  Diagnosis Date  . Diabetes mellitus, type II (HCC)   . Hyperlipidemia     Patient Active Problem List   Diagnosis Date Noted  . Class 1 obesity due to excess calories with serious comorbidity and body mass index (BMI) of 31.0 to 31.9 in adult 04/11/2017  . Erectile dysfunction 07/05/2016  . Uncontrolled type 2 diabetes mellitus with complication, with long-term current use of insulin (HCC) 09/14/2015  . Essential hypertension, benign 09/14/2015  . Mixed hyperlipidemia 09/14/2015    Past Surgical History:  Procedure Laterality Date  . ROTATOR CUFF REPAIR         Family History  Problem Relation Age of Onset  . Diabetes Father     Social History   Tobacco Use  . Smoking status: Never Smoker  . Smokeless tobacco: Never Used  Vaping Use  . Vaping Use: Never used  Substance Use Topics  . Alcohol use: No    Alcohol/week: 0.0 standard drinks  .  Drug use: No    Home Medications Prior to Admission medications   Medication Sig Start Date End Date Taking? Authorizing Provider  aspirin 81 MG EC tablet Take 1 tablet by mouth daily. 12/29/19  Yes [provider]  canagliflozin (INVOKANA) 100 MG TABS tablet Take 1 tablet by mouth daily. 07/24/17  Yes [provider]  carvedilol (COREG) 3.125 MG tablet Take 3.125 mg by mouth 2 (two) times daily with a meal.   Yes [provider]  Exenatide ER 2 MG SRER Inject 2 mg into the skin once a week.   Yes [provider]  glipiZIDE (GLUCOTROL XL) 5 MG 24 hr tablet TAKE 1 TABLET BY MOUTH EVERY DAY WITH BREAKFAST Patient taking differently: Take 5 mg by mouth daily with breakfast.  11/15/19  Yes Nida, Denman George, MD  insulin degludec (TRESIBA FLEXTOUCH) 200 UNIT/ML FlexTouch Pen Inject 100 Units into the skin at bedtime. 02/24/20  Yes [provider]  insulin lispro (HUMALOG KWIKPEN) 200 UNIT/ML KwikPen Inject 28-36 Units into the skin in the morning, at noon, and at bedtime. 04/21/19  Yes [provider]  metFORMIN (GLUCOPHAGE) 1000 MG tablet TAKE 1 TABLET TWICE A DAY Patient taking differently: Take 1,000 mg by mouth in the morning and at bedtime.  01/12/20  Yes Roma Kayser, MD  prasugrel (EFFIENT) 10 MG TABS  tablet Take 10 mg by mouth daily. 02/25/20  Yes [provider]  predniSONE (DELTASONE) 10 MG tablet Take 10-40 mg by mouth as directed. Patient takes 4 tablets by mouth daily for 3 days, then 3 tablets by mouth daily for 3 days, then 2 tablets by mouth daily for 3 days, then 1 tablet by mouth daily for days, then 1 tablet by mouth daily as needed   Yes [provider]  ranolazine (RANEXA) 500 MG 12 hr tablet Take 500 mg by mouth 2 (two) times daily. 03/16/20  Yes [provider]  rosuvastatin (CRESTOR) 40 MG tablet Take 40 mg by mouth daily.   Yes [provider]  Continuous Blood Gluc Sensor (FREESTYLE  LIBRE 14 DAY SENSOR) MISC INJECT 1 EACH INTO THE SKIN ONCE EVERY 14 DAYS. USE AS DIRECTED. Patient taking differently: 1 Device by Other route every 14 (fourteen) days.  02/02/20   Roma Kayser, MD  Insulin Pen Needle (ULTICARE MICRO PEN NEEDLES) 32G X 4 MM MISC qid Patient taking differently: 1 Device by Other route in the morning, at noon, in the evening, and at bedtime. qid 10/22/17   Roma Kayser, MD  sildenafil (VIAGRA) 50 MG tablet TAKE 1 TABLET BY MOUTH AS DIRECTED Patient taking differently: Take 50 mg by mouth as needed.  01/26/20   Roma Kayser, MD    Allergies    Patient has no known allergies.  Review of Systems   Review of Systems  Constitutional: Positive for appetite change and unexpected weight change. Negative for chills and fever.  Respiratory: Negative for chest tightness and shortness of breath.   Cardiovascular: Negative for chest pain.  Gastrointestinal: Positive for abdominal pain and diarrhea. Negative for abdominal distention, anal bleeding, blood in stool, nausea and vomiting.  Genitourinary: Negative for decreased urine volume, difficulty urinating, dysuria and flank pain.  Musculoskeletal: Negative for arthralgias, back pain and myalgias.  Skin: Negative for color change and rash.  Neurological: Positive for weakness. Negative for dizziness, syncope, numbness and headaches.  Hematological: Negative for adenopathy.  Psychiatric/Behavioral: Negative for confusion.    Physical Exam Updated Vital Signs BP (!) 112/57   Pulse 90   Temp 98.5 F (36.9 C) (Oral)   Resp (!) 22   Ht 6\' 1"  (1.854 m)   Wt 99.8 kg   SpO2 96%   BMI 29.03 kg/m   Physical Exam Vitals and nursing note reviewed.  Constitutional:      Appearance: Normal appearance. He is not toxic-appearing.  HENT:     Mouth/Throat:     Mouth: Mucous membranes are moist.  Cardiovascular:     Rate and Rhythm: Normal rate and regular rhythm.     Pulses: Normal pulses.    Pulmonary:     Breath sounds: Normal breath sounds.  Chest:     Chest wall: No tenderness.  Abdominal:     General: There is no distension.     Palpations: Abdomen is soft.     Tenderness: There is no abdominal tenderness. There is no right CVA tenderness, left CVA tenderness, guarding or rebound.  Musculoskeletal:        General: Normal range of motion.     Cervical back: Normal range of motion.  Skin:    General: Skin is warm.     Findings: No rash.  Neurological:     General: No focal deficit present.     Mental Status: He is alert.     Sensory: No sensory deficit.  Motor: No weakness.     ED Results / Procedures / Treatments   Labs (all labs ordered are listed, but only abnormal results are displayed) Labs Reviewed  CBC WITH DIFFERENTIAL/PLATELET - Abnormal; Notable for the following components:      Result Value   WBC 12.0 (*)    Neutro Abs 8.8 (*)    Monocytes Absolute 1.1 (*)    All other components within normal limits  BASIC METABOLIC PANEL - Abnormal; Notable for the following components:   Glucose, Bld 154 (*)    Calcium 8.4 (*)    All other components within normal limits  HEPATIC FUNCTION PANEL - Abnormal; Notable for the following components:   AST 60 (*)    ALT 92 (*)    All other components within normal limits  LIPASE, BLOOD - Abnormal; Notable for the following components:   Lipase 53 (*)    All other components within normal limits  URINALYSIS, ROUTINE W REFLEX MICROSCOPIC - Abnormal; Notable for the following components:   APPearance CLOUDY (*)    All other components within normal limits    EKG None  Radiology CT ABDOMEN PELVIS W CONTRAST  Result Date: 04/14/2020 CLINICAL DATA:  Mid abdominal pain. EXAM: CT ABDOMEN AND PELVIS WITH CONTRAST TECHNIQUE: Multidetector CT imaging of the abdomen and pelvis was performed using the standard protocol following bolus administration of intravenous contrast. CONTRAST:  OMNIPAQUE IOHEXOL 300  MG/ML  SOLN COMPARISON:  None. FINDINGS: Lower chest: A 6 mm noncalcified lung nodule is seen within the anterior aspect of the right lung base. Hepatobiliary: No focal liver abnormality is seen. No gallstones, gallbladder wall thickening, or biliary dilatation. Pancreas: Unremarkable. No pancreatic ductal dilatation or surrounding inflammatory changes. Spleen: Normal in size without focal abnormality. Adrenals/Urinary Tract: The right adrenal gland is normal in appearance. A 9 mm isodense left adrenal mass is noted. Kidneys are normal, without renal calculi, focal lesion, or hydronephrosis. The urinary bladder is partially contracted and otherwise normal in appearance. Stomach/Bowel: Stomach is within normal limits. The appendix is not clearly identified. No evidence of bowel wall thickening, distention, or inflammatory changes. Vascular/Lymphatic: There is mild calcification of the abdominal aorta. No enlarged abdominal or pelvic lymph nodes. Reproductive: Prostate is unremarkable. Other: No abdominal wall hernia or abnormality. No abdominopelvic ascites. Musculoskeletal: No acute or significant osseous findings. IMPRESSION: 1. 6 mm noncalcified lung nodule within the anterior aspect of the right lung base. Correlation with chest CT is recommended. 2. 9 mm isodense left adrenal mass which may represent an adrenal adenoma. Correlation with adrenal protocol CT is recommended. 3. Aortic atherosclerosis. Aortic Atherosclerosis (ICD10-I70.0). Electronically Signed   By: Aram Candela M.D.   On: 04/14/2020 15:08    Procedures Procedures (including critical care time)  Medications Ordered in ED Medications  sodium chloride 0.9 % bolus 1,000 mL (1,000 mLs Intravenous New Bag/Given 04/14/20 1411)  ondansetron (ZOFRAN) injection 4 mg (4 mg Intravenous Given 04/14/20 1411)  iohexol (OMNIPAQUE) 300 MG/ML solution 100 mL (100 mLs Intravenous Contrast Given 04/14/20 1426)    ED Course  I have reviewed the triage  vital signs and the nursing notes.  Pertinent labs & imaging results that were available during my care of the patient were reviewed by me and considered in my medical decision making (see chart for details).    MDM Rules/Calculators/A&P  Pt with chronic diarrhea, generalized weakness.  Reports recent weight loss.  Labs w/o significant leukocytosis.  Lipase mildly elevated.  CT abd/pelvis shows adrenal mass and lung nodule.  These findings discussed with patient and likely unrelated. Doubt infectious source and pt recently had negative stool culture and negative C diff.  No findings to suggest surgical abdomen.   Discussed  Symptomatic tx with Imodium and importance of close f/u with GI and PCP f/u regarding adrenal mass and lung nodule.  Strict return precautions also discussed   Final Clinical Impression(s) / ED Diagnoses Final diagnoses:  Diarrhea, unspecified type  Lung nodule  Adrenal mass, left [facility])    Rx / DC Orders ED Discharge Orders    None       Bufford Lope 04/17/20 2219    Milton Ferguson, MD 04/17/20 2316

## 2020-04-17 ENCOUNTER — Telehealth: Payer: Self-pay | Admitting: *Deleted

## 2020-04-17 ENCOUNTER — Encounter: Payer: Self-pay | Admitting: Internal Medicine

## 2020-04-17 NOTE — Telephone Encounter (Signed)
Ok to schedule ov.  

## 2020-04-17 NOTE — Telephone Encounter (Signed)
Pt called in and wanted to make new pt ov with [place].  F/U from ER visit at [facility].  He said that he suffers with chronic diarrhea and has lost 16 lbs since 03/23/20.  He said that he had heart stents placed back in April as well.  [phone]

## 2020-04-17 NOTE — Telephone Encounter (Signed)
PATIENT SCHEDULED  °

## 2020-04-25 NOTE — Telephone Encounter (Signed)
Left message for pt to return call as we received a prescription refill request from pharmacy for Humalog Kwikpen, Patients Humalin R script has been approved from insurance company,need to know if he is ready to fill this now.

## 2020-04-26 ENCOUNTER — Other Ambulatory Visit: Payer: Self-pay

## 2020-04-26 ENCOUNTER — Ambulatory Visit (INDEPENDENT_AMBULATORY_CARE_PROVIDER_SITE_OTHER): Payer: BC Managed Care – PPO | Admitting: Nurse Practitioner

## 2020-04-26 ENCOUNTER — Encounter: Payer: Self-pay | Admitting: Nurse Practitioner

## 2020-04-26 DIAGNOSIS — R109 Unspecified abdominal pain: Secondary | ICD-10-CM | POA: Insufficient documentation

## 2020-04-26 DIAGNOSIS — R1084 Generalized abdominal pain: Secondary | ICD-10-CM

## 2020-04-26 DIAGNOSIS — R197 Diarrhea, unspecified: Secondary | ICD-10-CM | POA: Diagnosis not present

## 2020-04-26 MED ORDER — DICYCLOMINE HCL 20 MG PO TABS: 20.0000 mg | ORAL_TABLET | Freq: Three times a day (TID) | ORAL | 2 refills | Status: AC | PRN

## 2020-04-26 NOTE — Assessment & Plan Note (Signed)
The patient describes acute onset diarrhea in May 2021.  He was in the hospital in April 2021 for an MI and stent.  He has been in the emergency room twice since then, although this was for his diarrhea and abdominal pain.  Denies recent antibiotics, travel, sick contacts.  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  He does have increased risk for C. difficile.  Primary care did a stool culture which was negative, as per HPI.  At this point I will check a C. difficile which was not completed yet as well as a GI pathogen panel for completeness.  Recommend he continue his current medications that are helping him manage his symptoms.  Encourage fluids, bland diet.  I will check a BMP to ensure he is not becoming overly dehydrated or at risk for acute kidney injury given his frequent diarrhea (although improved at 4 stools a day compared to 10-12 previously).  I have also recommended he start a probiotic and have given him recommendations for popular brands.  I will plan to see him back shortly in 2 to 4 weeks.  He may need a colonoscopy to evaluate for other etiologies such as microscopic colitis.  I doubt interaction or etiology due to adrenal mass, although this is being worked up by his primary care.

## 2020-04-26 NOTE — Progress Notes (Signed)
Primary Care Physician:  Dhivianathan, Birdie Hopes, MD Primary Gastroenterologist:  Dr. Jena Gauss  Chief Complaint  Patient presents with  . Diarrhea    daily; started 03/24/20; had TCS 04/2019  . Weight Loss    lost approx 18 lbs    HPI:   George White is a 59 y.o. male who presents on referral from the emergency department for diarrhea.  No previous colonoscopy or endoscopy in our system.  The patient was in the emergency room 04/14/2020 for diarrhea.  Noted history of diabetes and hypertension and presented with left-sided abdominal pain, diarrhea, dizziness and generalized weakness.  Symptoms present since 03/23/2020, was previously seen in ER [id number] for similar symptoms.  Has seen PCP and had negative C. difficile and stool samples.  Multiple episodes of watery diarrhea that he describes as light-colored, no blood or melena.  Weakness and intermittent dizziness/likely orthostasis.  Described fullness and bloating.  15 pound weight loss since symptoms began the months prior.  Labs found mild leukocytosis at 12.0, lipase mildly elevated.  CT of the abdomen and pelvis with adrenal mass and lung nodule and deemed likely unrelated.  Doubt infectious source, no surgical abdomen.  Discussed symptomatic treatment with Imodium and importance of close follow-up with GI and PCP regarding adrenal mass and lung nodule.  Strict return precautions provided.  The patient was discharged home in satisfactory condition.  Today he states he's doing ok overall. Diarrhea started suddenly while at Dr. Isidoro Donning office. He thought he was having anopther MI, but had a fever (relieved with Tylenol). Had an MI the month before onset of diarrhea and was started on effluent (2% ADE diarrhea) and a simvastatin (undefined frequency ADE diarrhea; changed to rosuvastatin recently). Has been on Metformin for years, recently d/c. Diarrhea initially was near constant (at least 10-12 stools a day). Has improved to about 4 stools a day.  Imodium causes some constipation. Bentyl and GasX helps the most. Denies hematochezia, melena. Diabetes doing well, hgb a1c on 03/24/20 was 8.2. Feels it is likely better now. Patch sensor demonstrates average CBG the past couple weeks has been 110. Had stool culture done and negative for Salmonella, Shigella, Campylobacter, and enterohemorrhagic E. Coli. No CDiff done that he's aware of. Denies recent travel, sick contacts, recent antibiotics. 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 He had a lot of N/V initially; now with nausea associated with gas pains, but no vomiting. Denies any hematemesis. Denies fever, chills. Is having weight loss (16 lb subjectively; 15 lbs based on weights at cardiac rehab). Denies URI or flu-like symptoms. Denies loss of sense of taste or smell. The patient has received COVID-19 vaccination(s). Denies chest pain, dyspnea, dizziness, lightheadedness, syncope, near syncope. Denies any other upper or lower GI symptoms.  Is drinking not as much water as needed due to nausea; urine has been dark yellow. Eating a bland diet.  Past Medical History:  Diagnosis Date  . Diabetes mellitus, type II (HCC)   . Hyperlipidemia     Past Surgical History:  Procedure Laterality Date  . ROTATOR CUFF REPAIR      Current Outpatient Medications  Medication Sig Dispense Refill  . aspirin 81 MG EC tablet Take 1 tablet by mouth daily.    . canagliflozin (INVOKANA) 100 MG TABS tablet Take 1 tablet by mouth daily.    . carvedilol (  COREG) 3.125 MG tablet Take 3.125 mg by mouth 2 (two) times daily with a meal.    . cetirizine (ZYRTEC) 10 MG chewable tablet Chew 10 mg by mouth as needed for allergies.    . Continuous Blood Gluc Sensor (FREESTYLE LIBRE 14 DAY SENSOR) MISC INJECT 1 EACH INTO THE SKIN ONCE EVERY 14 DAYS. USE AS DIRECTED. (Patient taking differently: 1 Device by  Other route every 14 (fourteen) days. ) 2 each 2  . dicyclomine (BENTYL) 20 MG tablet Take 1 tablet (20 mg total) by mouth 3 (three) times daily as needed for spasms (or diarrhea). 30 tablet 2  . HYDROcodone-acetaminophen (NORCO/VICODIN) 5-325 MG tablet Take one tab po q 4 hrs prn pain 8 tablet 0  . Insulin Pen Needle (ULTICARE MICRO PEN NEEDLES) 32G X 4 MM MISC qid (Patient taking differently: 1 Device by Other route in the morning, at noon, in the evening, and at bedtime. qid) 200 each 2  . insulin regular human CONCENTRATED (HUMULIN R U-500 KWIKPEN) 500 UNIT/ML kwikpen Inject 50 Units into the skin 3 (three) times daily with meals.    Marland Kitchen loperamide (IMODIUM) 1 MG/5ML solution Take 10 mLs (2 mg total) by mouth as needed for diarrhea or loose stools. 120 mL 0  . prasugrel (EFFIENT) 10 MG TABS tablet Take 10 mg by mouth daily.    . rosuvastatin (CRESTOR) 40 MG tablet Take 40 mg by mouth daily.    . sildenafil (VIAGRA) 50 MG tablet TAKE 1 TABLET BY MOUTH AS DIRECTED (Patient taking differently: Take 50 mg by mouth as needed. ) 6 tablet 1  . Simethicone (GAS-X PO) Take by mouth as needed.     No current facility-administered medications for this visit.    Allergies as of 04/26/2020  . (No Known Allergies)    Family History  Problem Relation Age of Onset  . Diabetes Father   . Ulcers Father   . Prostate cancer Father   . Colon cancer Neg Hx   . Gastric cancer Neg Hx   . Esophageal cancer Neg Hx     Social History   Socioeconomic History  . Marital status: Married    Spouse name: Not on file  . Number of children: Not on file  . Years of education: Not on file  . Highest education level: Not on file  Occupational History  . Not on file  Tobacco Use  . Smoking status: Never Smoker  . Smokeless tobacco: Never Used  Vaping Use  . Vaping Use: Never used  Substance and Sexual Activity  . Alcohol use: Yes    Alcohol/week: 0.0 standard drinks    Comment: occ  . Drug use: No  .  Sexual activity: Not on file  Other Topics Concern  . Not on file  Social History Narrative  . Not on file   Social Determinants of Health   Financial Resource Strain:   . Difficulty of Paying Living Expenses:   Food Insecurity:   . Worried About Programme researcher, broadcasting/film/video in the Last Year:   . Barista in the Last Year:   Transportation Needs:   . Freight forwarder (Medical):   Marland Kitchen Lack of Transportation (Non-Medical):   Physical Activity:   . Days of Exercise per Week:   . Minutes of Exercise per Session:   Stress:   . Feeling of Stress :   Social Connections:   . Frequency of Communication with Friends and Family:   . Frequency  of Social Gatherings with Friends and Family:   . Attends Religious Services:   . Active Member of Clubs or Organizations:   . Attends Banker Meetings:   Marland Kitchen Marital Status:   Intimate Partner Violence:   . Fear of Current or Ex-Partner:   . Emotionally Abused:   Marland Kitchen Physically Abused:   . Sexually Abused:     Subjective: Review of Systems  Constitutional: Negative for chills, fever, malaise/fatigue and weight loss.  HENT: Negative for congestion and sore throat.   Respiratory: Negative for cough and shortness of breath.   Cardiovascular: Negative for chest pain and palpitations.  Gastrointestinal: Negative for abdominal pain, blood in stool, diarrhea, melena, nausea and vomiting.  Musculoskeletal: Negative for joint pain and myalgias.  Skin: Negative for rash.  Neurological: Negative for dizziness and weakness.  Endo/Heme/Allergies: Does not bruise/bleed easily.  Psychiatric/Behavioral: Negative for depression. The patient is not nervous/anxious.   All other systems reviewed and are negative.      Objective: BP 105/66   Pulse 86   Temp 98.7 F (37.1 C) (Temporal)   Ht 6\' 1"  (1.854 m)   Wt 230 lb (104.3 kg)   BMI 30.34 kg/m  Physical Exam Vitals and nursing note reviewed.  Constitutional:      General: He is not  in acute distress.    Appearance: Normal appearance. He is not ill-appearing, toxic-appearing or diaphoretic.  HENT:     Head: Normocephalic and atraumatic.     Nose: No congestion or rhinorrhea.  Eyes:     General: No scleral icterus. Cardiovascular:     Rate and Rhythm: Normal rate and regular rhythm.     Heart sounds: Normal heart sounds.  Pulmonary:     Effort: Pulmonary effort is normal.     Breath sounds: Normal breath sounds.  Abdominal:     General: Bowel sounds are normal. There is no distension.     Palpations: Abdomen is soft. There is no hepatomegaly, splenomegaly or mass.     Tenderness: There is no abdominal tenderness. There is no guarding or rebound.     Hernia: No hernia is present.  Musculoskeletal:     Cervical back: Neck supple.  Skin:    General: Skin is warm and dry.     Coloration: Skin is not jaundiced.     Findings: No bruising or rash.  Neurological:     General: No focal deficit present.     Mental Status: He is alert and oriented to person, place, and time. Mental status is at baseline.  Psychiatric:        Mood and Affect: Mood normal.        Behavior: Behavior normal.        Thought Content: Thought content normal.       04/26/2020 4:33 PM   Disclaimer: This note was dictated with voice recognition software. Similar sounding words can inadvertently be transcribed and may not be corrected upon review.

## 2020-04-26 NOTE — Assessment & Plan Note (Signed)
Given his abdominal pain and gas symptoms with his diarrhea I am more prone to think likely infectious etiology versus less likely IBS with acute onset.  Possible self-limiting viral gastroenteritis with postinfectious IBS is a possibility however.  He has had some improvement with Gas-X and Bentyl.  He has not become toxic on this in the past month.  I have recommended he can continue this to help manage his symptoms while we continue work-up.  Further work-up for diarrhea as per above.  I feel when his acute diarrheal presentation is improved his abdominal discomfort will likely improve as well.  Possible role for colonoscopy in the near future if persistent diarrhea, even though he did have a colonoscopy last year in [place].

## 2020-04-26 NOTE — Patient Instructions (Addendum)
Your health issues we discussed today were:   Sudden onset of diarrhea and abdominal pain: 1. I am sorry you are not feeling well! 2. Have your labs drawn when you are able to 3. You will need to go to the lab to get the stool study collection kit and then bring your stool sample back to the lab.  It must be liquid stool to be tested 4. Continue taking your current medications 5. You can start taking a probiotic to help as well.  Some brands we have had good luck with include Align, Restora, [employer], and Culturelle 6. Call us if you have any worsening or severe symptoms 7. Continue to drink as much fluids as you can to stay hydrated 8. Continue the bland diet you have been eating 9. Further recommendations will be made when we get results back  Overall I recommend:  1. Continue your other current medications 2. Return for follow-up in 2 to 4 weeks 3. Call us if you have any questions or concerns   ---------------------------------------------------------------  I am glad you have gotten your COVID-19 vaccination!  Even though you are fully vaccinated you should continue to follow CDC and state/local guidelines.  ---------------------------------------------------------------   At [facility] we value your feedback. You may receive a survey about your visit today. Please share your experience as we strive to create trusting relationships with our patients to provide genuine, compassionate, quality care.  We appreciate your understanding and patience as we review any laboratory studies, imaging, and other diagnostic tests that are ordered as we care for you. Our office policy is 5 business days for review of these results, and any emergent or urgent results are addressed in a timely manner for your best interest. If you do not hear from our office in 1 week, please contact us.   We also encourage the use of MyChart, which contains your medical information for  your review as well. If you are not enrolled in this feature, an access code is on this after visit summary for your convenience. Thank you for allowing [place] to be involved in your care.  It was great to see you today!  I hope you have a great Summer!!

## 2020-04-27 ENCOUNTER — Other Ambulatory Visit (HOSPITAL_COMMUNITY)
Admission: RE | Admit: 2020-04-27 | Discharge: 2020-04-27 | Disposition: A | Discharge status: Home / Self Care | Payer: BC Managed Care – PPO | Source: Ambulatory Visit | Attending: Nurse Practitioner | Admitting: Nurse Practitioner

## 2020-04-27 ENCOUNTER — Other Ambulatory Visit: Payer: Self-pay

## 2020-04-27 DIAGNOSIS — R1084 Generalized abdominal pain: Secondary | ICD-10-CM | POA: Insufficient documentation

## 2020-04-27 DIAGNOSIS — R197 Diarrhea, unspecified: Secondary | ICD-10-CM | POA: Insufficient documentation

## 2020-04-27 LAB — CBC WITH DIFFERENTIAL/PLATELET
Abs Immature Granulocytes: 0.02 10*3/uL (ref 0.00–0.07)
Basophils Absolute: 0 10*3/uL (ref 0.0–0.1)
Basophils Relative: 1 %
Eosinophils Absolute: 0.2 10*3/uL (ref 0.0–0.5)
Eosinophils Relative: 3 %
HCT: 40 % (ref 39.0–52.0)
Hemoglobin: 13 g/dL (ref 13.0–17.0)
Immature Granulocytes: 0 %
Lymphocytes Relative: 22 %
Lymphs Abs: 1.8 10*3/uL (ref 0.7–4.0)
MCH: 29.3 pg (ref 26.0–34.0)
MCHC: 32.5 g/dL (ref 30.0–36.0)
MCV: 90.1 fL (ref 80.0–100.0)
Monocytes Absolute: 0.8 10*3/uL (ref 0.1–1.0)
Monocytes Relative: 10 %
Neutro Abs: 5.2 10*3/uL (ref 1.7–7.7)
Neutrophils Relative %: 64 %
Platelets: 251 10*3/uL (ref 150–400)
RBC: 4.44 MIL/uL (ref 4.22–5.81)
RDW: 12.6 % (ref 11.5–15.5)
WBC: 8.1 10*3/uL (ref 4.0–10.5)
nRBC: 0 % (ref 0.0–0.2)

## 2020-04-27 LAB — BASIC METABOLIC PANEL
Anion gap: 9 (ref 5–15)
BUN: 8 mg/dL (ref 6–20)
CO2: 24 mmol/L (ref 22–32)
Calcium: 8.6 mg/dL — ABNORMAL LOW (ref 8.9–10.3)
Chloride: 105 mmol/L (ref 98–111)
Creatinine, Ser: 0.75 mg/dL (ref 0.61–1.24)
GFR calc Af Amer: >60 mL/min (ref >60)
GFR calc non Af Amer: >60 mL/min (ref >60)
Glucose, Bld: 157 mg/dL — ABNORMAL HIGH (ref 70–99)
Potassium: 3.9 mmol/L (ref 3.5–5.1)
Sodium: 138 mmol/L (ref 135–145)

## 2020-04-28 ENCOUNTER — Other Ambulatory Visit: Payer: Self-pay

## 2020-04-28 ENCOUNTER — Other Ambulatory Visit (HOSPITAL_COMMUNITY)
Admission: RE | Admit: 2020-04-28 | Discharge: 2020-04-28 | Disposition: A | Discharge status: Home / Self Care | Payer: BC Managed Care – PPO | Source: Ambulatory Visit | Attending: Nurse Practitioner | Admitting: Nurse Practitioner

## 2020-04-28 DIAGNOSIS — R197 Diarrhea, unspecified: Secondary | ICD-10-CM | POA: Insufficient documentation

## 2020-04-28 DIAGNOSIS — R1084 Generalized abdominal pain: Secondary | ICD-10-CM | POA: Diagnosis present

## 2020-04-28 LAB — C DIFFICILE QUICK SCREEN W PCR REFLEX
C Diff antigen: NEGATIVE
C Diff interpretation: NOT DETECTED
C Diff toxin: NEGATIVE

## 2020-04-29 LAB — GASTROINTESTINAL PANEL BY PCR, STOOL (REPLACES STOOL CULTURE)
Adenovirus F40/41: NOT DETECTED
Astrovirus: NOT DETECTED
Campylobacter species: NOT DETECTED
Cryptosporidium: NOT DETECTED
Cyclospora cayetanensis: DETECTED — AB
Entamoeba histolytica: NOT DETECTED
Enteroaggregative E coli (EAEC): NOT DETECTED
Enteropathogenic E coli (EPEC): NOT DETECTED
Enterotoxigenic E coli (ETEC): NOT DETECTED
Giardia lamblia: NOT DETECTED
Norovirus GI/GII: NOT DETECTED
Plesimonas shigelloides: NOT DETECTED
Rotavirus A: NOT DETECTED
Salmonella species: NOT DETECTED
Sapovirus (I, II, IV, and V): NOT DETECTED
Shiga like toxin producing E coli (STEC): NOT DETECTED
Shigella/Enteroinvasive E coli (EIEC): NOT DETECTED
Vibrio cholerae: NOT DETECTED
Vibrio species: NOT DETECTED
Yersinia enterocolitica: NOT DETECTED

## 2020-05-02 ENCOUNTER — Telehealth: Payer: Self-pay | Admitting: "Endocrinology

## 2020-05-02 NOTE — Telephone Encounter (Signed)
Pt requesting refill on Insulin Pen Needle (ULTICARE MICRO PEN NEEDLES) 32G X 4 MM MISC. [facility]

## 2020-05-05 MED ORDER — ULTICARE MICRO PEN NEEDLES 32G X 4 MM MISC: 1.0000 | Freq: Four times a day (QID) | 3 refills | Status: AC

## 2020-05-05 NOTE — Telephone Encounter (Signed)
Rx refill sent.

## 2020-05-08 ENCOUNTER — Telehealth: Payer: Self-pay | Admitting: Internal Medicine

## 2020-05-08 MED ORDER — SULFAMETHOXAZOLE-TRIMETHOPRIM 800-160 MG PO TABS: 1.0000 | ORAL_TABLET | Freq: Two times a day (BID) | ORAL | 0 refills | Status: AC

## 2020-05-08 NOTE — Telephone Encounter (Signed)
EG, pt saw his results on mychart. Pt has a f/u 05/2020 and he wants to make sure he if he needs an antibiotic, he starts it before his apt. Pt states he saw a parasite was found on labs. Pt is aware that EG is out of the office and will return tomorrow.

## 2020-05-08 NOTE — Telephone Encounter (Signed)
AB sent to EG, he is out of the office today. Received a call from the Health Department Toniann Fail). She will notify pt of results. Pt is aware of results due to reviewing them on my chart. Please advise if I need to tell pt anything prior to EG coming in tomorrow.

## 2020-05-08 NOTE — Telephone Encounter (Signed)
Spoke with pt. Pt notified of results and that Antibiotic was sent to his local pharmacy. Pt is aware that the Health departmentToniann Fail) called our office and will call pt today or tomorrow.

## 2020-05-08 NOTE — Telephone Encounter (Addendum)
I sent in Bactrim DS 1 tablet BID [facility]. Further recommendations per Minerva Areola tomorrow.

## 2020-05-08 NOTE — Telephone Encounter (Signed)
Pt said he had labs done recently and was mentioned something about a parasite and did EG want to call something into the pharmacy. He doesn't have an FU OV until later next month. Please advise. [phone]

## 2020-05-08 NOTE — Addendum Note (Signed)
Addended by: Gelene Mink on: 05/08/2020 02:12 PM   Modules accepted: Orders

## 2020-05-15 ENCOUNTER — Other Ambulatory Visit: Payer: Self-pay

## 2020-05-15 ENCOUNTER — Encounter (HOSPITAL_COMMUNITY): Payer: Self-pay

## 2020-05-15 NOTE — Progress Notes (Signed)
[facility] [address], [place] [address]   CLINIC:  Medical Oncology/Hematology  CONSULT NOTE  Patient Care Team: Dhivianathan, Birdie Hopes, MD as PCP - General (Family Medicine) Jena Gauss, Gerrit Friends, MD as Consulting Physician (Gastroenterology)  CHIEF COMPLAINTS/PURPOSE OF CONSULTATION:  Evaluation of adrenal mass and lung nodule  HISTORY OF PRESENTING ILLNESS:  [name] 59 y.o. male is here because of evaluation of adrenal mass and lung nodule, at the request of Dr. Damaris Schooner at [facility]. A CT AP done on 04/14/2020 showed a noncalcified right lung base nodule and a left adrenal mass.  Today he is accompanied by his wife. He reports that he only had a CT AP done, not a PET scan. He is currently taking Bactrim BID for Cyclospora; his last dose is today. He is not immunocompromised. His symptoms are slowly improving. He has lost about 17 lbs since his GI symptoms started, including diarrhea, but denies cramping. Since starting his GI cocktail, his stools have become soft. He has eczema with multiple rashes and applies a cream. His appetite is good when he is not feeling nauseous and having diarrhea. He denies any recent respiratory infections, including COVID.  He had an MI in 02/23/2020 and had stents put in. He has never smoked. 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 His paternal uncle had black lung after working in [employer].   MEDICAL HISTORY:  Past Medical History:  Diagnosis Date  . Diabetes mellitus, type II (HCC)   . Heart attack (HCC) 02/23/2020   Multiple stents placed  . Hyperlipidemia     SURGICAL HISTORY: Past Surgical History:  Procedure Laterality Date  . KNEE SURGERY    . ROTATOR CUFF REPAIR      SOCIAL HISTORY: Social History   Socioeconomic History  . Marital status: Married    Spouse name: Not on file  . Number of children: 1   . Years of education: Not on file  . Highest education level: Not on file  Occupational History  . Occupation: EMPLOYED  Tobacco Use  . Smoking status: Never Smoker  . Smokeless tobacco: Never Used  Vaping Use  . Vaping Use: Never used  Substance and Sexual Activity  . Alcohol use: Yes    Alcohol/week: 0.0 standard drinks    Comment: occ  . Drug use: No  . Sexual activity: Not on file  Other Topics Concern  . Not on file  Social History Narrative  . Not on file   Social Determinants of Health   Financial Resource Strain: Low Risk   . Difficulty of Paying Living Expenses: Not hard at all  Food Insecurity: No Food Insecurity  . Worried About Programme researcher, broadcasting/film/video in the Last Year: Never true  . Ran Out of Food in the Last Year: Never true  Transportation Needs: No Transportation Needs  . Lack of Transportation (Medical): No  . Lack of Transportation (Non-Medical): No  Physical Activity: Inactive  . Days of Exercise per Week: 0 days  . Minutes of Exercise per Session: 0 min  Stress: No Stress Concern Present  . Feeling of Stress : Not at all  Social Connections: Moderately Integrated  . Frequency of Communication with Friends and Family: More than three times a week  . Frequency of Social Gatherings with Friends and Family: Three times a week  . Attends Religious Services:  Never  . Active Member of Clubs or Organizations: Yes  . Attends Banker Meetings: More than 4 times per year  . Marital Status: Married  Catering manager Violence: Not At Risk  . Fear of Current or Ex-Partner: No  . Emotionally Abused: No  . Physically Abused: No  . Sexually Abused: No    FAMILY HISTORY: Family History  Problem Relation Age of Onset  . Ulcers Father   . Prostate cancer Father   . Dementia Maternal Grandmother   . Heart attack Paternal Grandmother   . Stroke Paternal Grandfather   . Colon cancer Neg Hx   . Gastric cancer Neg Hx   . Esophageal cancer Neg Hx      ALLERGIES:  has No Known Allergies.  MEDICATIONS:  Current Outpatient Medications  Medication Sig Dispense Refill  . aspirin 81 MG EC tablet Take 1 tablet by mouth daily.    . carvedilol (COREG) 3.125 MG tablet Take 3.125 mg by mouth 2 (two) times daily with a meal.    . cetirizine (ZYRTEC) 10 MG chewable tablet Chew 10 mg by mouth as needed for allergies.    . Continuous Blood Gluc Sensor (FREESTYLE LIBRE 14 DAY SENSOR) MISC INJECT 1 EACH INTO THE SKIN ONCE EVERY 14 DAYS. USE AS DIRECTED. (Patient taking differently: 1 Device by Other route every 14 (fourteen) days. ) 2 each 2  . dicyclomine (BENTYL) 20 MG tablet Take 1 tablet (20 mg total) by mouth 3 (three) times daily as needed for spasms (or diarrhea). (Patient not taking: Reported on 05/15/2020) 30 tablet 2  . Insulin Pen Needle (ULTICARE MICRO PEN NEEDLES) 32G X 4 MM MISC 1 Device by Other route in the morning, at noon, in the evening, and at bedtime. qid 200 each 3  . insulin regular human CONCENTRATED (HUMULIN R U-500 KWIKPEN) 500 UNIT/ML kwikpen Inject 50 Units into the skin 3 (three) times daily with meals.    Marland Kitchen loperamide (IMODIUM) 1 MG/5ML solution Take 10 mLs (2 mg total) by mouth as needed for diarrhea or loose stools. (Patient not taking: Reported on 05/15/2020) 120 mL 0  . prasugrel (EFFIENT) 10 MG TABS tablet Take 10 mg by mouth daily.    . rosuvastatin (CRESTOR) 40 MG tablet Take 40 mg by mouth daily.    . sildenafil (VIAGRA) 50 MG tablet TAKE 1 TABLET BY MOUTH AS DIRECTED (Patient not taking: Reported on 05/15/2020) 6 tablet 1  . Simethicone (GAS-X PO) Take by mouth as needed. (Patient not taking: Reported on 05/15/2020)     No current facility-administered medications for this visit.    REVIEW OF SYSTEMS:   Review of Systems  Constitutional: Positive for appetite change (moderately decreased) and fatigue (moderate).  HENT:   Positive for sore throat.   Respiratory: Positive for cough (at night).   Gastrointestinal:  Positive for diarrhea and nausea.  Skin: Positive for itching (on legs) and rash (on legs).  Neurological: Positive for dizziness, headaches and numbness (fingers).  Psychiatric/Behavioral: Positive for sleep disturbance (occasional).  All other systems reviewed and are negative.    PHYSICAL EXAMINATION: ECOG PERFORMANCE STATUS: 0 - Asymptomatic  Vitals:   05/16/20 0807  BP: (!) 102/56  Pulse: 80  Resp: 18  Temp: (!) 96.7 F (35.9 C)  SpO2: 100%   Filed Weights   05/16/20 0807  Weight: 225 lb 9.6 oz (102.3 kg)   Physical Exam Vitals reviewed.  Constitutional:      Appearance: Normal appearance.  Cardiovascular:  Rate and Rhythm: Normal rate and regular rhythm.     Pulses: Normal pulses.     Heart sounds: Normal heart sounds.  Pulmonary:     Effort: Pulmonary effort is normal.     Breath sounds: Normal breath sounds.  Abdominal:     Palpations: Abdomen is soft. There is no hepatomegaly, splenomegaly or mass.     Tenderness: There is no abdominal tenderness.  Musculoskeletal:     Right lower leg: No edema.     Left lower leg: No edema.  Lymphadenopathy:     Cervical: No cervical adenopathy.     Upper Body:     Right upper body: No supraclavicular adenopathy.     Left upper body: No supraclavicular adenopathy.  Skin:    Findings: Rash (R elbow & anterior legs bilaterally) present.  Neurological:     General: No focal deficit present.     Mental Status: He is alert and oriented to person, place, and time.  Psychiatric:        Mood and Affect: Mood normal.        Behavior: Behavior normal.      LABORATORY DATA:  I have reviewed the data as listed CBC Latest Ref Rng & Units 04/27/2020 04/14/2020 03/24/2020  WBC 4.0 - 10.5 K/uL 8.1 12.0(H) 11.1(H)  Hemoglobin 13.0 - 17.0 g/dL 09.8 11.9 14.7  Hematocrit 39 - 52 % 40.0 44.3 46.7  Platelets 150 - 400 K/uL 251 282 223   CMP Latest Ref Rng & Units 04/27/2020 04/14/2020 03/24/2020  Glucose 70 - 99 mg/dL 829(F) 621(H)  086(V)  BUN 6 - 20 mg/dL 8 12 13   Creatinine 0.61 - 1.24 mg/dL 7.84 6.96 2.95  Sodium 135 - 145 mmol/L 138 137 136  Potassium 3.5 - 5.1 mmol/L 3.9 3.6 4.0  Chloride 98 - 111 mmol/L 105 103 102  CO2 22 - 32 mmol/L 24 24 24   Calcium 8.9 - 10.3 mg/dL 2.8(U) 1.3(K) 4.4(W)  Total Protein 6.5 - 8.1 g/dL - 7.4 -  Total Bilirubin 0.3 - 1.2 mg/dL - 0.5 -  Alkaline Phos 38 - 126 U/L - 75 -  AST 15 - 41 U/L - 60(H) -  ALT 0 - 44 U/L - 92(H) -    RADIOGRAPHIC STUDIES: I have personally reviewed the radiological images as listed and agreed with the findings in the report.  ASSESSMENT:  1.  Right lung nodule: -CTAP with contrast on 04/14/2020 showed incidental 6 mm noncalcified lung nodule within the anterior aspect of the right lung base.  There was also incidental 9 mm isodense left adrenal mass noted. -Patient was a never smoker. -Family history significant for father with metastatic prostate cancer.  2.  Cyclospora cayetanensis infection: -Recent diarrhea and abdominal cramping with 17 pound weight loss. 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 -He is finishing Bactrim today.  PLAN:  1.  Right lung nodule: -I have reviewed images of the CT scan with the patient and his wife. -I have recommended a CT scan of the chest with contrast to look at rest of the lung. -I will see him back after the scan.  2.  CAD: -Recent MI on 02/23/2020 with stents placed. -Continue Effient 10 mg daily, Crestor 40 mg daily, aspirin 81 mg daily.  3.  Diabetes: -Continue Humulin 50 units 3 times a day.   All questions were answered. The patient knows to call the clinic with any problems, questions or concerns.   Doreatha Massed, MD, 05/16/20 8:44 AM  [facility] [phone]   I, Drue Second, am acting as a scribe for Dr. Payton Mccallum.  I, Doreatha Massed MD, have reviewed the above documentation for accuracy and completeness, and I agree with the  above.

## 2020-05-16 ENCOUNTER — Encounter (HOSPITAL_COMMUNITY): Payer: Self-pay | Admitting: Hematology

## 2020-05-16 ENCOUNTER — Inpatient Hospital Stay (HOSPITAL_COMMUNITY): Payer: BC Managed Care – PPO | Attending: Hematology | Admitting: Hematology

## 2020-05-16 DIAGNOSIS — Z7982 Long term (current) use of aspirin: Secondary | ICD-10-CM | POA: Insufficient documentation

## 2020-05-16 DIAGNOSIS — Z79899 Other long term (current) drug therapy: Secondary | ICD-10-CM | POA: Insufficient documentation

## 2020-05-16 DIAGNOSIS — R918 Other nonspecific abnormal finding of lung field: Secondary | ICD-10-CM | POA: Diagnosis not present

## 2020-05-16 DIAGNOSIS — Z794 Long term (current) use of insulin: Secondary | ICD-10-CM | POA: Diagnosis not present

## 2020-05-16 DIAGNOSIS — Z8249 Family history of ischemic heart disease and other diseases of the circulatory system: Secondary | ICD-10-CM | POA: Diagnosis not present

## 2020-05-16 DIAGNOSIS — Z7902 Long term (current) use of antithrombotics/antiplatelets: Secondary | ICD-10-CM | POA: Diagnosis not present

## 2020-05-16 DIAGNOSIS — E119 Type 2 diabetes mellitus without complications: Secondary | ICD-10-CM | POA: Diagnosis not present

## 2020-05-16 DIAGNOSIS — Z8042 Family history of malignant neoplasm of prostate: Secondary | ICD-10-CM | POA: Diagnosis not present

## 2020-05-16 DIAGNOSIS — I252 Old myocardial infarction: Secondary | ICD-10-CM | POA: Diagnosis not present

## 2020-05-16 DIAGNOSIS — E785 Hyperlipidemia, unspecified: Secondary | ICD-10-CM | POA: Diagnosis not present

## 2020-05-16 DIAGNOSIS — R911 Solitary pulmonary nodule: Secondary | ICD-10-CM | POA: Insufficient documentation

## 2020-05-16 DIAGNOSIS — Z8049 Family history of malignant neoplasm of other genital organs: Secondary | ICD-10-CM

## 2020-05-16 DIAGNOSIS — E278 Other specified disorders of adrenal gland: Secondary | ICD-10-CM | POA: Insufficient documentation

## 2020-05-16 DIAGNOSIS — R197 Diarrhea, unspecified: Secondary | ICD-10-CM | POA: Diagnosis not present

## 2020-05-16 DIAGNOSIS — I2510 Atherosclerotic heart disease of native coronary artery without angina pectoris: Secondary | ICD-10-CM | POA: Diagnosis not present

## 2020-05-16 NOTE — Patient Instructions (Signed)
[facility] at [facility] Discharge Instructions  You were seen today by Dr. Ellin Saba. He went over your recent results and scans, including possible diagnoses. You will be scheduled for a CT scan of your chest. Dr. Ellin Saba will see you back in 1 week for labs and follow up.   Thank you for choosing [facility] at [facility] to provide your oncology and hematology care.  To afford each patient quality time with our provider, please arrive at least 15 minutes before your scheduled appointment time.   If you have a lab appointment with the Cancer Center please come in thru the Main Entrance and check in at the main information desk  You need to re-schedule your appointment should you arrive 10 or more minutes late.  We strive to give you quality time with our providers, and arriving late affects you and other patients whose appointments are after yours.  Also, if you no show three or more times for appointments you may be dismissed from the clinic at the providers discretion.     Again, thank you for choosing [facility].  Our hope is that these requests will decrease the amount of time that you wait before being seen by our physicians.       _____________________________________________________________  Should you have questions after your visit to [facility], please contact our office at [phone] between the hours of 8:00 a.m. and 4:30 p.m.  Voicemails left after 4:00 p.m. will not be returned until the following business day.  For prescription refill requests, have your pharmacy contact our office and allow 72 hours.    Cancer Center Support Programs:   > Cancer Support Group  2nd Tuesday of the month 1pm-2pm, Journey Room

## 2020-05-17 ENCOUNTER — Other Ambulatory Visit: Payer: Self-pay | Admitting: "Endocrinology

## 2020-05-24 ENCOUNTER — Other Ambulatory Visit (HOSPITAL_COMMUNITY): Payer: Self-pay | Admitting: *Deleted

## 2020-05-24 DIAGNOSIS — R911 Solitary pulmonary nodule: Secondary | ICD-10-CM

## 2020-05-25 ENCOUNTER — Ambulatory Visit (HOSPITAL_COMMUNITY)
Admission: RE | Admit: 2020-05-25 | Discharge: 2020-05-25 | Disposition: A | Discharge status: Home / Self Care | Payer: BC Managed Care – PPO | Source: Ambulatory Visit | Attending: Hematology | Admitting: Hematology

## 2020-05-25 ENCOUNTER — Other Ambulatory Visit: Payer: Self-pay

## 2020-05-25 ENCOUNTER — Inpatient Hospital Stay (HOSPITAL_COMMUNITY): Payer: BC Managed Care – PPO

## 2020-05-25 DIAGNOSIS — R911 Solitary pulmonary nodule: Secondary | ICD-10-CM | POA: Diagnosis present

## 2020-05-25 DIAGNOSIS — R918 Other nonspecific abnormal finding of lung field: Secondary | ICD-10-CM | POA: Diagnosis not present

## 2020-05-25 LAB — CBC WITH DIFFERENTIAL/PLATELET
Abs Immature Granulocytes: 0.04 K/uL (ref 0.00–0.07)
Basophils Absolute: 0 K/uL (ref 0.0–0.1)
Basophils Relative: 0 %
Eosinophils Absolute: 0.2 K/uL (ref 0.0–0.5)
Eosinophils Relative: 2 %
HCT: 38.6 % — ABNORMAL LOW (ref 39.0–52.0)
Hemoglobin: 12.5 g/dL — ABNORMAL LOW (ref 13.0–17.0)
Immature Granulocytes: 0 %
Lymphocytes Relative: 23 %
Lymphs Abs: 2.1 K/uL (ref 0.7–4.0)
MCH: 29.6 pg (ref 26.0–34.0)
MCHC: 32.4 g/dL (ref 30.0–36.0)
MCV: 91.5 fL (ref 80.0–100.0)
Monocytes Absolute: 0.7 K/uL (ref 0.1–1.0)
Monocytes Relative: 8 %
Neutro Abs: 6 K/uL (ref 1.7–7.7)
Neutrophils Relative %: 67 %
Platelets: 221 K/uL (ref 150–400)
RBC: 4.22 MIL/uL (ref 4.22–5.81)
RDW: 12.8 % (ref 11.5–15.5)
WBC: 9.1 K/uL (ref 4.0–10.5)
nRBC: 0 % (ref 0.0–0.2)

## 2020-05-25 LAB — COMPREHENSIVE METABOLIC PANEL WITH GFR
ALT: 35 U/L (ref 0–44)
AST: 23 U/L (ref 15–41)
Albumin: 3.8 g/dL (ref 3.5–5.0)
Alkaline Phosphatase: 83 U/L (ref 38–126)
Anion gap: 7 (ref 5–15)
BUN: 8 mg/dL (ref 6–20)
CO2: 26 mmol/L (ref 22–32)
Calcium: 8.6 mg/dL — ABNORMAL LOW (ref 8.9–10.3)
Chloride: 103 mmol/L (ref 98–111)
Creatinine, Ser: 0.69 mg/dL (ref 0.61–1.24)
GFR calc Af Amer: >60 mL/min
GFR calc non Af Amer: >60 mL/min
Glucose, Bld: 126 mg/dL — ABNORMAL HIGH (ref 70–99)
Potassium: 3.6 mmol/L (ref 3.5–5.1)
Sodium: 136 mmol/L (ref 135–145)
Total Bilirubin: 0.3 mg/dL (ref 0.3–1.2)
Total Protein: 7.2 g/dL (ref 6.5–8.1)

## 2020-05-25 MED ORDER — IOHEXOL 300 MG/ML  SOLN
75.0000 mL | Freq: Once | INTRAMUSCULAR | Status: AC | PRN
  Administered 2020-05-25: 75 mL via INTRAVENOUS

## 2020-05-26 ENCOUNTER — Inpatient Hospital Stay (HOSPITAL_COMMUNITY): Payer: BC Managed Care – PPO | Admitting: Hematology

## 2020-05-26 VITALS — BP 110/71 | HR 83 | Temp 97.3°F | Resp 18 | Wt 228.9 lb

## 2020-05-26 DIAGNOSIS — R911 Solitary pulmonary nodule: Secondary | ICD-10-CM | POA: Diagnosis not present

## 2020-05-26 DIAGNOSIS — R918 Other nonspecific abnormal finding of lung field: Secondary | ICD-10-CM | POA: Diagnosis not present

## 2020-05-26 NOTE — Patient Instructions (Signed)
[facility] at [facility] Discharge Instructions  You were seen and examined today by Dr. Ellin Saba. He reviewed your recent CT scan, which is a good scan.  The area of concern appears to be benign. Should you notice any purple striping on your body, weight gain and increased blood pressure, please call the office. We will repeat abdominal imaging in 6 months and chest imagine in one year.  We will follow-up with you in 6 months after that scan.  Thank you for choosing  Cancer Center at [facility] to provide your oncology and hematology care.  To afford each patient quality time with our provider, please arrive at least 15 minutes before your scheduled appointment time.   If you have a lab appointment with the Cancer Center please come in thru the Main Entrance and check in at the main information desk.  You need to re-schedule your appointment should you arrive 10 or more minutes late.  We strive to give you quality time with our providers, and arriving late affects you and other patients whose appointments are after yours.  Also, if you no show three or more times for appointments you may be dismissed from the clinic at the providers discretion.     Again, thank you for choosing [facility].  Our hope is that these requests will decrease the amount of time that you wait before being seen by our physicians.       _____________________________________________________________  Should you have questions after your visit to [facility], please contact our office at [phone] and follow the prompts.  Our office hours are 8:00 a.m. and 4:30 p.m. Monday - Friday.  Please note that voicemails left after 4:00 p.m. may not be returned until the following business day.  We are closed weekends and major holidays.  You do have access to a nurse 24-7, just call the main number to the clinic [phone] and do not press any options, hold  on the line and a nurse will answer the phone.    For prescription refill requests, have your pharmacy contact our office and allow 72 hours.    Due to Covid, you will need to wear a mask upon entering the hospital. If you do not have a mask, a mask will be given to you at the Main Entrance upon arrival. For doctor visits, patients may have 1 support person age 33 or older with them. For treatment visits, patients can not have anyone with them due to social distancing guidelines and our immunocompromised population.

## 2020-05-26 NOTE — Progress Notes (Signed)
[facility] [address], [place] [address]   CLINIC:  Medical Oncology/Hematology  PCP:  Dorice Lamas, MD [address] / [place] [address] [phone]   REASON FOR VISIT:  Follow-up for nodule of right lung  PRIOR THERAPY: None  NGS Results: Not done  CURRENT THERAPY: Observation  BRIEF ONCOLOGIC HISTORY:  Oncology History   No history exists.    CANCER STAGING: Cancer Staging No matching staging information was found for the patient.  INTERVAL HISTORY:  George White, a 59 y.o. male, returns for routine follow-up of his nodule of right lung. George White was last seen on 05/16/2020.  Today George White is accompanied by his wife. George White reports feeling well overall. George White denies having any melena, hematochezia or hematuria. His diarrhea is resolved and George White denies having any excessive fatigue.   REVIEW OF SYSTEMS:  Review of Systems  Constitutional: Positive for fatigue (mild). Negative for appetite change.  Gastrointestinal: Negative for blood in stool and diarrhea.  Genitourinary: Negative for hematuria.   All other systems reviewed and are negative.   PAST MEDICAL/SURGICAL HISTORY:  Past Medical History:  Diagnosis Date  . Diabetes mellitus, type II (HCC)   . Heart attack (HCC) 02/23/2020   Multiple stents placed  . Hyperlipidemia    Past Surgical History:  Procedure Laterality Date  . KNEE SURGERY    . ROTATOR CUFF REPAIR      SOCIAL HISTORY:  Social History   Socioeconomic History  . Marital status: Married    Spouse name: Not on file  . Number of children: 1  . Years of education: Not on file  . Highest education level: Not on file  Occupational History  . Occupation: EMPLOYED  Tobacco Use  . Smoking status: Never Smoker  . Smokeless tobacco: Never Used  Vaping Use  . Vaping Use: Never used  Substance and Sexual Activity  . Alcohol use: Yes    Alcohol/week: 0.0 standard drinks    Comment: occ  .  Drug use: No  . Sexual activity: Not on file  Other Topics Concern  . Not on file  Social History Narrative  . Not on file   Social Determinants of Health   Financial Resource Strain: Low Risk   . Difficulty of Paying Living Expenses: Not hard at all  Food Insecurity: No Food Insecurity  . Worried About Programme researcher, broadcasting/film/video in the Last Year: Never true  . Ran Out of Food in the Last Year: Never true  Transportation Needs: No Transportation Needs  . Lack of Transportation (Medical): No  . Lack of Transportation (Non-Medical): No  Physical Activity: Inactive  . Days of Exercise per Week: 0 days  . Minutes of Exercise per Session: 0 min  Stress: No Stress Concern Present  . Feeling of Stress : Not at all  Social Connections: Moderately Integrated  . Frequency of Communication with Friends and Family: More than three times a week  . Frequency of Social Gatherings with Friends and Family: Three times a week  . Attends Religious Services: Never  . Active Member of Clubs or Organizations: Yes  . Attends Banker Meetings: More than 4 times per year  . Marital Status: Married  Catering manager Violence: Not At Risk  . Fear of Current or Ex-Partner: No  . Emotionally Abused: No  . Physically Abused: No  . Sexually Abused: No    FAMILY HISTORY:  Family History  Problem Relation  Age of Onset  . Ulcers Father   . Prostate cancer Father   . Dementia Maternal Grandmother   . Heart attack Paternal Grandmother   . Stroke Paternal Grandfather   . Colon cancer Neg Hx   . Gastric cancer Neg Hx   . Esophageal cancer Neg Hx     CURRENT MEDICATIONS:  Current Outpatient Medications  Medication Sig Dispense Refill  . aspirin 81 MG EC tablet Take 1 tablet by mouth daily.    . carvedilol (COREG) 3.125 MG tablet Take 3.125 mg by mouth 2 (two) times daily with a meal.    . cetirizine (ZYRTEC) 10 MG chewable tablet Chew 10 mg by mouth as needed for allergies.    . Continuous  Blood Gluc Sensor (FREESTYLE LIBRE 14 DAY SENSOR) MISC INJECT 1 INTO THE SKIN ONCE EVERY 14 DAYS AS DIRECTED 2 each 2  . dicyclomine (BENTYL) 20 MG tablet Take 1 tablet (20 mg total) by mouth 3 (three) times daily as needed for spasms (or diarrhea). (Patient not taking: Reported on 05/15/2020) 30 tablet 2  . Insulin Pen Needle (ULTICARE MICRO PEN NEEDLES) 32G X 4 MM MISC 1 Device by Other route in the morning, at noon, in the evening, and at bedtime. qid 200 each 3  . insulin regular human CONCENTRATED (HUMULIN R U-500 KWIKPEN) 500 UNIT/ML kwikpen Inject 50 Units into the skin 3 (three) times daily with meals.    Marland Kitchen loperamide (IMODIUM) 1 MG/5ML solution Take 10 mLs (2 mg total) by mouth as needed for diarrhea or loose stools. (Patient not taking: Reported on 05/15/2020) 120 mL 0  . prasugrel (EFFIENT) 10 MG TABS tablet Take 10 mg by mouth daily.    . rosuvastatin (CRESTOR) 40 MG tablet Take 40 mg by mouth daily.    . sildenafil (VIAGRA) 50 MG tablet TAKE 1 TABLET BY MOUTH AS DIRECTED 6 tablet 1  . Simethicone (GAS-X PO) Take by mouth as needed. (Patient not taking: Reported on 05/15/2020)     No current facility-administered medications for this visit.    ALLERGIES:  No Known Allergies  PHYSICAL EXAM:  Performance status (ECOG): 0 - Asymptomatic  Vitals:   05/26/20 1116  BP: 110/71  Pulse: 83  Resp: 18  Temp: (!) 97.3 F (36.3 C)  SpO2: 96%   Wt Readings from Last 3 Encounters:  05/26/20 (!) 228 lb 14.4 oz (103.8 kg)  05/16/20 225 lb 9.6 oz (102.3 kg)  04/26/20 230 lb (104.3 kg)   Physical Exam Vitals reviewed.  Constitutional:      Appearance: Normal appearance. George White is obese.  Cardiovascular:     Rate and Rhythm: Normal rate and regular rhythm.     Pulses: Normal pulses.     Heart sounds: Normal heart sounds.  Pulmonary:     Effort: Pulmonary effort is normal.     Breath sounds: Normal breath sounds.  Neurological:     General: No focal deficit present.     Mental Status:  George White is alert and oriented to person, place, and time.  Psychiatric:        Mood and Affect: Mood normal.        Behavior: Behavior normal.      LABORATORY DATA:  I have reviewed the labs as listed.  CBC Latest Ref Rng & Units 05/25/2020 04/27/2020 04/14/2020  WBC 4.0 - 10.5 K/uL 9.1 8.1 12.0(H)  Hemoglobin 13.0 - 17.0 g/dL 12.5(L) 13.0 14.4  Hematocrit 39 - 52 % 38.6(L) 40.0 44.3  Platelets  150 - 400 K/uL 221 251 282   CMP Latest Ref Rng & Units 05/25/2020 04/27/2020 04/14/2020  Glucose 70 - 99 mg/dL 678(L) 381(O) 175(Z)  BUN 6 - 20 mg/dL 8 8 12   Creatinine 0.61 - 1.24 mg/dL 0.25 8.52  Sodium 135 - 145 mmol/L 136 138 137  Potassium 3.5 - 5.1 mmol/L 3.6 3.9 3.6  Chloride 98 - 111 mmol/L 103 105 103  CO2 22 - 32 mmol/L 26 24 24   Calcium 8.9 - 10.3 mg/dL 7.78) ) 2.4(M)  Total Protein 6.5 - 8.1 g/dL 7.2 - 7.4  Total Bilirubin 0.3 - 1.2 mg/dL 0.3 - 0.5  Alkaline Phos 38 - 126 U/L 83 - 75  AST 15 - 41 U/L 23 - 60(H)  ALT 0 - 44 U/L 35 - 92(H)    DIAGNOSTIC IMAGING:  I have independently reviewed the scans and discussed with the patient. CT Chest W Contrast  Result Date: 05/25/2020 CLINICAL DATA:  Follow-up right lung nodule seen on CT of the abdomen and pelvis EXAM: CT CHEST WITH CONTRAST TECHNIQUE: Multidetector CT imaging of the chest was performed during intravenous contrast administration. CONTRAST:  46mL OMNIPAQUE IOHEXOL 300 MG/ML  SOLN COMPARISON:  CT abdomen pelvis, 04/14/2020 FINDINGS: Cardiovascular: No significant vascular findings. Normal heart size. Three-vessel coronary artery calcifications and/or stents. No pericardial effusion. Mediastinum/Nodes: No enlarged mediastinal, hilar, or axillary lymph nodes. Thyroid gland, trachea, and esophagus demonstrate no significant findings. Lungs/Pleura: Unchanged 7 mm pulmonary nodule of the inferior right upper lobe, which on CT examination of the chest is more clearly demonstrated as a definitively benign fissural nodule (series  4, image 85). Definitively benign 4 mm fissural nodule of the superior segment right lower lobe (series 4, image 85). Definitively benign 3 mm fissural nodule of the posterior left upper lobe (series 4, image 38). No pleural effusion or pneumothorax. Upper Abdomen: No acute abnormality. Hepatic steatosis. Subcentimeter left adrenal nodule, better evaluated by prior CT of the abdomen pelvis (series 2, image 154). Musculoskeletal: No chest wall mass or suspicious bone lesions identified. IMPRESSION: 1. Unchanged 7 mm pulmonary nodule of the inferior right upper lobe, which on CT examination of the chest is more clearly demonstrated as a definitively benign fissural nodule. Definitively benign small fissural nodules of the superior segment right lower lobe and posterior left upper lobe. 2. Three-vessel coronary artery calcifications and/or stents. 3. Hepatic steatosis. 4. Subcentimeter left adrenal nodule, better evaluated by prior CT of the abdomen pelvis although likely a benign adenoma. Electronically Signed   By: 71m M.D.   On: 05/25/2020 17:11     ASSESSMENT:  1.  Right lung nodule: -CTAP with contrast on 04/14/2020 showed incidental 6 mm noncalcified lung nodule within the anterior aspect of the right lung base.  There was also incidental 9 mm isodense left adrenal mass noted. -Patient was a never smoker. -Family history significant for father with metastatic prostate cancer.  2.  Cyclospora cayetanensis infection: -Recent diarrhea and abdominal cramping with 17 pound weight loss. 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 -George White was treated with Bactrim.  3.  CAD: -Recent MI on 02/15/2020 with stents placed.   PLAN:  1.  Right lung nodule: -We reviewed results of CT chest from 05/25/2020.  7 mm right upper lobe nodule consistent with benign fissural nodule.  Benign small fissural nodules of the superior segment right lower lobe and posterior left upper lobe.  Hepatic  steatosis.  Subcentimeter left adrenal nodule. -As George White is a never  smoker, George White is at low risk for malignancy. -Plan to repeat CT chest in 1 year.  2.  CAD: -Continue Effient, aspirin 81 mg and Crestor daily.  3.  Diabetes: -Continue Humulin 50 units 3 times a day.  4.  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 -George White does not have any symptoms of Cushing syndrome at this time. -I plan to repeat CT abdomen adrenal protocol in 6 months.  We will also check serum cortisol level.  5.  Mild anemia: -His CBC on 05/25/2020 shows hemoglobin 12.5, slightly decreased from his baseline of 13-14. -Most likely from Effient and aspirin.  Will check CBC in 6 months.   Orders placed this encounter:  No orders of the defined types were placed in this encounter.    Doreatha Massed, MD [facility] [phone]   I, Drue Second, am acting as a scribe for Dr. Payton Mccallum.  I, Doreatha Massed MD, have reviewed the above documentation for accuracy and completeness, and I agree with the above.

## 2020-06-12 ENCOUNTER — Ambulatory Visit: Payer: BC Managed Care – PPO | Admitting: Gastroenterology

## 2020-06-21 ENCOUNTER — Ambulatory Visit: Payer: BC Managed Care – PPO | Admitting: Gastroenterology

## 2020-06-29 ENCOUNTER — Ambulatory Visit: Payer: BC Managed Care – PPO | Admitting: "Endocrinology

## 2020-06-29 ENCOUNTER — Other Ambulatory Visit: Payer: Self-pay | Admitting: "Endocrinology

## 2020-07-05 ENCOUNTER — Encounter: Payer: Self-pay | Admitting: "Endocrinology

## 2020-07-05 LAB — COMPREHENSIVE METABOLIC PANEL
Calcium: 8.6 — AB (ref 8.7–10.7)
GFR calc Af Amer: 117
GFR calc non Af Amer: 101

## 2020-07-05 LAB — BASIC METABOLIC PANEL
BUN: 11 (ref 4–21)
Creatinine: 0.7 (ref 0.6–1.3)
Potassium: 4.3 (ref 3.4–5.3)

## 2020-07-05 LAB — TSH: TSH: 5.19 (ref 0.41–5.90)

## 2020-07-05 LAB — VITAMIN D 25 HYDROXY (VIT D DEFICIENCY, FRACTURES): Vit D, 25-Hydroxy: 31.9

## 2020-07-05 LAB — HEMOGLOBIN A1C: Hemoglobin A1C: 8

## 2020-07-06 ENCOUNTER — Telehealth: Payer: Self-pay | Admitting: "Endocrinology

## 2020-07-06 NOTE — Telephone Encounter (Signed)
Please advise, we do not have samples of humulin r.

## 2020-07-06 NOTE — Telephone Encounter (Signed)
Patient will pick up sample of fiasp.

## 2020-07-06 NOTE — Telephone Encounter (Signed)
He is on Humalog ( Not Humulin). He can have that or Novolog or Fiasp to use in place of Humalog.

## 2020-07-06 NOTE — Telephone Encounter (Signed)
Patient is asking for a sample of humalin bc the pharmacy will not have his until Monday and he will run out on Saturday.

## 2020-07-11 ENCOUNTER — Encounter: Payer: Self-pay | Admitting: "Endocrinology

## 2020-07-11 ENCOUNTER — Other Ambulatory Visit: Payer: Self-pay

## 2020-07-11 ENCOUNTER — Ambulatory Visit (INDEPENDENT_AMBULATORY_CARE_PROVIDER_SITE_OTHER): Payer: BC Managed Care – PPO | Admitting: "Endocrinology

## 2020-07-11 VITALS — BP 114/65 | HR 84 | Ht 73.0 in | Wt 240.6 lb

## 2020-07-11 DIAGNOSIS — I10 Essential (primary) hypertension: Secondary | ICD-10-CM | POA: Diagnosis not present

## 2020-07-11 DIAGNOSIS — E782 Mixed hyperlipidemia: Secondary | ICD-10-CM

## 2020-07-11 DIAGNOSIS — E1159 Type 2 diabetes mellitus with other circulatory complications: Secondary | ICD-10-CM | POA: Diagnosis not present

## 2020-07-11 MED ORDER — HUMULIN R U-500 KWIKPEN 500 UNIT/ML ~~LOC~~ SOPN: 60.0000 [IU] | PEN_INJECTOR | Freq: Three times a day (TID) | SUBCUTANEOUS | 2 refills | Status: DC

## 2020-07-11 MED ORDER — HUMULIN R U-500 KWIKPEN 500 UNIT/ML ~~LOC~~ SOPN: 50.0000 [IU] | PEN_INJECTOR | Freq: Three times a day (TID) | SUBCUTANEOUS | 2 refills | Status: DC

## 2020-07-11 MED ORDER — METFORMIN HCL ER 500 MG PO TB24: 500.0000 mg | ORAL_TABLET | Freq: Every day | ORAL | 3 refills | Status: DC

## 2020-07-11 NOTE — Progress Notes (Signed)
Endocrinology Telehealth Visit 07/11/2020  Endocrinology follow-up note   Subjective:    Patient ID: George White, male    DOB: Feb 13, 1961,    Past Medical History:  Diagnosis Date  . Diabetes mellitus, type II (HCC)   . Heart attack (HCC) 02/23/2020   Multiple stents placed  . Hyperlipidemia    Past Surgical History:  Procedure Laterality Date  . KNEE SURGERY    . ROTATOR CUFF REPAIR     Social History   Socioeconomic History  . Marital status: Married    Spouse name: Not on file  . Number of children: 1  . Years of education: Not on file  . Highest education level: Not on file  Occupational History  . Occupation: EMPLOYED  Tobacco Use  . Smoking status: Never Smoker  . Smokeless tobacco: Never Used  Vaping Use  . Vaping Use: Never used  Substance and Sexual Activity  . Alcohol use: Yes    Alcohol/week: 0.0 standard drinks    Comment: occ  . Drug use: No  . Sexual activity: Not on file  Other Topics Concern  . Not on file  Social History Narrative  . Not on file   Social Determinants of Health   Financial Resource Strain: Low Risk   . Difficulty of Paying Living Expenses: Not hard at all  Food Insecurity: No Food Insecurity  . Worried About Programme researcher, broadcasting/film/video in the Last Year: Never true  . Ran Out of Food in the Last Year: Never true  Transportation Needs: No Transportation Needs  . Lack of Transportation (Medical): No  . Lack of Transportation (Non-Medical): No  Physical Activity: Inactive  . Days of Exercise per Week: 0 days  . Minutes of Exercise per Session: 0 min  Stress: No Stress Concern Present  . Feeling of Stress : Not at all  Social Connections: Moderately Integrated  . Frequency of Communication with Friends and Family: More than three times a week  . Frequency of Social Gatherings with Friends and Family: Three times a week  . Attends Religious Services: Never  . Active Member of  Clubs or Organizations: Yes  . Attends Banker Meetings: More than 4 times per year  . Marital Status: Married   Outpatient Encounter Medications as of 07/11/2020  Medication Sig  . aspirin 81 MG EC tablet Take 1 tablet by mouth daily.  . carvedilol (COREG) 3.125 MG tablet Take 3.125 mg by mouth 2 (two) times daily with a meal.  . cetirizine (ZYRTEC) 10 MG chewable tablet Chew 10 mg by mouth as needed for allergies.  . Continuous Blood Gluc Sensor (FREESTYLE LIBRE 14 DAY SENSOR) MISC INJECT 1 INTO THE SKIN ONCE EVERY 14 DAYS AS DIRECTED  . dicyclomine (BENTYL) 20 MG tablet Take 1 tablet (20 mg total) by mouth 3 (three) times daily as needed for spasms (or diarrhea).  . Insulin Pen Needle (ULTICARE MICRO PEN NEEDLES) 32G X 4 MM MISC 1 Device by Other route in the morning, at noon, in the evening, and at bedtime. qid  . insulin regular human CONCENTRATED (HUMULIN R U-500 KWIKPEN) 500 UNIT/ML kwikpen Inject 60 Units into the skin 3 (three) times daily  with meals.  Marland Kitchen lisinopril (ZESTRIL) 5 MG tablet Take 5 mg by mouth daily.  . metFORMIN (GLUCOPHAGE XR) 500 MG 24 hr tablet Take 1 tablet (500 mg total) by mouth daily with breakfast.  . prasugrel (EFFIENT) 10 MG TABS tablet Take 10 mg by mouth daily.  . rosuvastatin (CRESTOR) 10 MG tablet Take 10 mg by mouth at bedtime.  . sildenafil (VIAGRA) 50 MG tablet TAKE 1 TABLET BY MOUTH AS DIRECTED (Patient not taking: Reported on 05/26/2020)  . [DISCONTINUED] glipiZIDE (GLUCOTROL XL) 5 MG 24 hr tablet TAKE 1 TABLET BY MOUTH EVERY DAY WITH BREAKFAST  . [DISCONTINUED] insulin regular human CONCENTRATED (HUMULIN R U-500 KWIKPEN) 500 UNIT/ML kwikpen Inject 50 Units into the skin 3 (three) times daily with meals.  . [DISCONTINUED] insulin regular human CONCENTRATED (HUMULIN R U-500 KWIKPEN) 500 UNIT/ML kwikpen Inject 50 Units into the skin 3 (three) times daily with meals.  . [DISCONTINUED] loperamide (IMODIUM) 1 MG/5ML solution Take 10 mLs (2 mg total)  by mouth as needed for diarrhea or loose stools. (Patient not taking: Reported on 05/26/2020)  . [DISCONTINUED] Simethicone (GAS-X PO) Take by mouth as needed.  (Patient not taking: Reported on 05/26/2020)   No facility-administered encounter medications on file as of 07/11/2020.   ALLERGIES: No Known Allergies VACCINATION STATUS: Immunization History  Administered Date(s) Administered  . Moderna SARS-COVID-2 Vaccination 10/26/2019, 11/24/2019    Diabetes He presents for his follow-up diabetic visit. He has type 2 diabetes mellitus. Onset time: He was diagnosed at approximate age of 29 years. His disease course has been improving (Since his last visit he did have coronary artery disease which required 2 stent placements.). There are no hypoglycemic associated symptoms. Pertinent negatives for hypoglycemia include no confusion, headaches, pallor or seizures. Associated symptoms include polydipsia and polyuria. Pertinent negatives for diabetes include no chest pain, no fatigue, no polyphagia and no weakness. There are no hypoglycemic complications. Symptoms are improving. Diabetic complications include heart disease and impotence. Risk factors for coronary artery disease include diabetes mellitus, dyslipidemia, hypertension, male sex, sedentary lifestyle and obesity. Current diabetic treatment includes insulin injections and oral agent (dual therapy) (As well as metformin and Invokana.). He is compliant with treatment most of the time. His weight is fluctuating minimally. He is following a generally unhealthy diet. When asked about meal planning, he reported none. He participates in exercise intermittently. His home blood glucose trend is fluctuating minimally. His breakfast blood glucose range is generally 140-180 mg/dl. His lunch blood glucose range is generally 140-180 mg/dl. His dinner blood glucose range is generally 140-180 mg/dl. His bedtime blood glucose range is generally 140-180 mg/dl. His overall  blood glucose range is 140-180 mg/dl. (He wears a CGM device: His printouts show 60% time in range, 38% above range.  No hypoglycemia.  His previsit labs show A1c of 8%, overall improving from 10.2%.  He is responding to the U500 insulin.  He remains off of his Metformin and glipizide as well as Bydureon.   ) An ACE inhibitor/angiotensin II receptor blocker is not being taken.  Hyperlipidemia This is a chronic problem. The current episode started more than 1 year ago. The problem is uncontrolled. Exacerbating diseases include diabetes and obesity. Pertinent negatives include no chest pain, myalgias or shortness of breath. Current antihyperlipidemic treatment includes statins. Risk factors for coronary artery disease include diabetes mellitus, dyslipidemia, hypertension, a sedentary lifestyle, obesity and male sex.  Hypertension This is a chronic problem. The current episode started more than 1 year ago. Pertinent  negatives include no chest pain, headaches, neck pain, palpitations or shortness of breath. Risk factors for coronary artery disease include dyslipidemia, diabetes mellitus, obesity and sedentary lifestyle.    Review of systems  Constitutional: + Minimally fluctuating body weight,  current  Body mass index is 31.74 kg/m. , no fatigue, no subjective hyperthermia, no subjective hypothermia Eyes: no blurry vision, no xerophthalmia ENT: no sore throat, no nodules palpated in throat, no dysphagia/odynophagia, no hoarseness Cardiovascular: no Chest Pain, no Shortness of Breath, no palpitations, no leg swelling Respiratory: no cough, no shortness of breath Gastrointestinal: no Nausea/Vomiting/Diarhhea Musculoskeletal: no muscle/joint aches Skin: no rashes, no hyperemia Neurological: no tremors, no numbness, no tingling, no dizziness Psychiatric: no depression, no anxiety   Objective:    BP 114/65   Pulse 84   Ht 6\' 1"  (1.854 m)   Wt 240 lb 9.6 oz (109.1 kg)   BMI 31.74 kg/m   Wt  Readings from Last 3 Encounters:  07/11/20 240 lb 9.6 oz (109.1 kg)  05/26/20 (!) 228 lb 14.4 oz (103.8 kg)  05/16/20 225 lb 9.6 oz (102.3 kg)       Physical Exam- Limited  Constitutional:  Body mass index is 31.74 kg/m. , not in acute distress, normal state of mind Eyes:  EOMI, no exophthalmos Neck: Supple Thyroid: No gross goiter Respiratory: Adequate breathing efforts Musculoskeletal: no gross deformities, strength intact in all four extremities, no gross restriction of joint movements Skin:  no rashes, no hyperemia Neurological: no tremor with outstretched hands    Recent Results (from the past 2160 hour(s))  CBC with Differential     Status: Abnormal   Collection Time: 04/14/20  1:00 PM  Result Value Ref Range   WBC 12.0 (H) 4.0 - 10.5 K/uL   RBC 4.86 4.22 - 5.81 MIL/uL   Hemoglobin 14.4 13.0 - 17.0 g/dL   HCT 16.1 39 - 52 %   MCV 91.2 80.0 - 100.0 fL   MCH 29.6 26.0 - 34.0 pg   MCHC 32.5 30.0 - 36.0 g/dL   RDW 09.6 04.5 - 40.9 %   Platelets 282 150 - 400 K/uL   nRBC 0.0 0.0 - 0.2 %   Neutrophils Relative % 74 %   Neutro Abs 8.8 (H) 1.7 - 7.7 K/uL   Lymphocytes Relative 15 %   Lymphs Abs 1.8 0.7 - 4.0 K/uL   Monocytes Relative 9 %   Monocytes Absolute 1.1 (H) 0 - 1 K/uL   Eosinophils Relative 2 %   Eosinophils Absolute 0.2 0 - 0 K/uL   Basophils Relative 0 %   Basophils Absolute 0.0 0 - 0 K/uL   Immature Granulocytes 0 %   Abs Immature Granulocytes 0.04 0.00 - 0.07 K/uL    Comment: Performed at [facility], [address] [place], [place] [address]  Basic metabolic panel     Status: Abnormal   Collection Time: 04/14/20  1:00 PM  Result Value Ref Range   Sodium 137 135 - 145 mmol/L   Potassium 3.6 3.5 - 5.1 mmol/L   Chloride 103 98 - 111 mmol/L   CO2 24 22 - 32 mmol/L   Glucose, Bld 154 (H) 70 - 99 mg/dL    Comment: Glucose reference range applies only to samples taken after fasting for at least 8 hours.   BUN 12 6 - 20 mg/dL   Creatinine, Ser 4.78 0.61  - 1.24 mg/dL   Calcium 8.4 (L) 8.9 - 10.3 mg/dL   GFR calc non Af  Amer >60 >60 mL/min   GFR calc Af Amer >60 >60 mL/min   Anion gap 10 5 - 15    Comment: Performed at [facility], [address], [place], [place] [address]  Hepatic function panel     Status: Abnormal   Collection Time: 04/14/20  1:00 PM  Result Value Ref Range   Total Protein 7.4 6.5 - 8.1 g/dL   Albumin 3.9 3.5 - 5.0 g/dL   AST 60 (H) 15 - 41 U/L   ALT 92 (H) 0 - 44 U/L   Alkaline Phosphatase 75 38 - 126 U/L   Total Bilirubin 0.5 0.3 - 1.2 mg/dL   Bilirubin, Direct 0.1 0.0 - 0.2 mg/dL   Indirect Bilirubin 0.4 0.3 - 0.9 mg/dL    Comment: Performed at [facility], [address] [place], [place] [address]  Lipase, blood     Status: Abnormal   Collection Time: 04/14/20  1:00 PM  Result Value Ref Range   Lipase 53 (H) 11 - 51 U/L    Comment: Performed at [facility], [address], [place], [place] [address]  Urinalysis, Routine w reflex microscopic     Status: Abnormal   Collection Time: 04/14/20  2:19 PM  Result Value Ref Range   Color, Urine YELLOW YELLOW   APPearance CLOUDY (A) CLEAR   Specific Gravity, Urine 1.024 1.005 - 1.030   pH 5.0 5.0 - 8.0   Glucose, UA NEGATIVE NEGATIVE mg/dL   Hgb urine dipstick NEGATIVE NEGATIVE   Bilirubin Urine NEGATIVE NEGATIVE   Ketones, ur NEGATIVE NEGATIVE mg/dL   Protein, ur NEGATIVE NEGATIVE mg/dL   Nitrite NEGATIVE NEGATIVE   Leukocytes,Ua NEGATIVE NEGATIVE    Comment: Performed at [facility], [address], [place], [place] [address]  CBC with Differential/Platelet     Status: None   Collection Time: 04/27/20 11:52 AM  Result Value Ref Range   WBC 8.1 4.0 - 10.5 K/uL   RBC 4.44 4.22 - 5.81 MIL/uL   Hemoglobin 13.0 13.0 - 17.0 g/dL   HCT 81.1 39 - 52 %   MCV 90.1 80.0 - 100.0 fL   MCH 29.3 26.0 - 34.0 pg   MCHC 32.5 30.0 - 36.0 g/dL   RDW 57.2 62.0 - 35.5 %   Platelets 251 150 - 400 K/uL   nRBC 0.0 0.0 - 0.2 %   Neutrophils Relative % 64 %   Neutro Abs 5.2  1.7 - 7.7 K/uL   Lymphocytes Relative 22 %   Lymphs Abs 1.8 0.7 - 4.0 K/uL   Monocytes Relative 10 %   Monocytes Absolute 0.8 0 - 1 K/uL   Eosinophils Relative 3 %   Eosinophils Absolute 0.2 0 - 0 K/uL   Basophils Relative 1 %   Basophils Absolute 0.0 0 - 0 K/uL   Immature Granulocytes 0 %   Abs Immature Granulocytes 0.02 0.00 - 0.07 K/uL    Comment: Performed at [facility], [address] [place], [place] [address]  Basic metabolic panel     Status: Abnormal   Collection Time: 04/27/20 11:52 AM  Result Value Ref Range   Sodium 138 135 - 145 mmol/L   Potassium 3.9 3.5 - 5.1 mmol/L   Chloride 105 98 - 111 mmol/L   CO2 24 22 - 32 mmol/L   Glucose, Bld 157 (H) 70 - 99 mg/dL    Comment: Glucose reference range applies only to samples taken after fasting for at least 8 hours.   BUN 8  6 - 20 mg/dL   Creatinine, Ser 6.28 0.61 - 1.24 mg/dL   Calcium 8.6 (L) 8.9 - 10.3 mg/dL   GFR calc non Af Amer >60 >60 mL/min   GFR calc Af Amer >60 >60 mL/min   Anion gap 9 5 - 15    Comment: Performed at [facility], [address] [place], [place] [address]  C Difficile Quick Screen w PCR reflex     Status: None   Collection Time: 04/28/20  3:23 PM   Specimen: STOOL  Result Value Ref Range   C Diff antigen NEGATIVE NEGATIVE   C Diff toxin NEGATIVE NEGATIVE   C Diff interpretation No C. difficile detected.     Comment: Performed at [facility], [address], [place], [place] [address]  Gastrointestinal Panel by PCR , Stool     Status: Abnormal   Collection Time: 04/28/20  3:24 PM   Specimen: Stool  Result Value Ref Range   Campylobacter species NOT DETECTED NOT DETECTED   Plesimonas shigelloides NOT DETECTED NOT DETECTED   Salmonella species NOT DETECTED NOT DETECTED   Yersinia enterocolitica NOT DETECTED NOT DETECTED   Vibrio species NOT DETECTED NOT DETECTED   Vibrio cholerae NOT DETECTED NOT DETECTED   Enteroaggregative E coli (EAEC) NOT DETECTED NOT DETECTED   Enteropathogenic E coli  (EPEC) NOT DETECTED NOT DETECTED   Enterotoxigenic E coli (ETEC) NOT DETECTED NOT DETECTED   Shiga like toxin producing E coli (STEC) NOT DETECTED NOT DETECTED   Shigella/Enteroinvasive E coli (EIEC) NOT DETECTED NOT DETECTED   Cryptosporidium NOT DETECTED NOT DETECTED   Cyclospora cayetanensis DETECTED (A) NOT DETECTED   Entamoeba histolytica NOT DETECTED NOT DETECTED   Giardia lamblia NOT DETECTED NOT DETECTED   Adenovirus F40/41 NOT DETECTED NOT DETECTED   Astrovirus NOT DETECTED NOT DETECTED   Norovirus GI/GII NOT DETECTED NOT DETECTED   Rotavirus A NOT DETECTED NOT DETECTED   Sapovirus (I, II, IV, and V) NOT DETECTED NOT DETECTED    Comment: Performed at [facility], [address] [place], [place] [address]  CBC with Differential     Status: Abnormal   Collection Time: 05/25/20 12:38 PM  Result Value Ref Range   WBC 9.1 4.0 - 10.5 K/uL   RBC 4.22 4.22 - 5.81 MIL/uL   Hemoglobin 12.5 (L) 13.0 - 17.0 g/dL   HCT 26.9 (L) 39 - 52 %   MCV 91.5 80.0 - 100.0 fL   MCH 29.6 26.0 - 34.0 pg   MCHC 32.4 30.0 - 36.0 g/dL   RDW 48.5 46.2 - 70.3 %   Platelets 221 150 - 400 K/uL   nRBC 0.0 0.0 - 0.2 %   Neutrophils Relative % 67 %   Neutro Abs 6.0 1.7 - 7.7 K/uL   Lymphocytes Relative 23 %   Lymphs Abs 2.1 0.7 - 4.0 K/uL   Monocytes Relative 8 %   Monocytes Absolute 0.7 0 - 1 K/uL   Eosinophils Relative 2 %   Eosinophils Absolute 0.2 0 - 0 K/uL   Basophils Relative 0 %   Basophils Absolute 0.0 0 - 0 K/uL   Immature Granulocytes 0 %   Abs Immature Granulocytes 0.04 0.00 - 0.07 K/uL    Comment: Performed at [facility], [address], [place], [place] [address]  Comprehensive metabolic panel     Status: Abnormal   Collection Time: 05/25/20 12:38 PM  Result Value Ref Range   Sodium 136 135 - 145 mmol/L   Potassium  3.6 3.5 - 5.1 mmol/L   Chloride 103 98 - 111 mmol/L   CO2 26 22 - 32 mmol/L   Glucose, Bld 126 (H) 70 - 99 mg/dL    Comment: Glucose reference range applies  only to samples taken after fasting for at least 8 hours.   BUN 8 6 - 20 mg/dL   Creatinine, Ser 1.44 0.61 - 1.24 mg/dL   Calcium 8.6 (L) 8.9 - 10.3 mg/dL   Total Protein 7.2 6.5 - 8.1 g/dL   Albumin 3.8 3.5 - 5.0 g/dL   AST 23 15 - 41 U/L   ALT 35 0 - 44 U/L   Alkaline Phosphatase 83 38 - 126 U/L   Total Bilirubin 0.3 0.3 - 1.2 mg/dL   GFR calc non Af Amer >60 >60 mL/min   GFR calc Af Amer >60 >60 mL/min   Anion gap 7 5 - 15    Comment: Performed at [facility], [address], [place], [place] [address]  VITAMIN D 25 Hydroxy (Vit-D Deficiency, Fractures)     Status: None   Collection Time: 07/05/20 12:00 AM  Result Value Ref Range   Vit D, 25-Hydroxy 31.9   Basic metabolic panel     Status: None   Collection Time: 07/05/20 12:00 AM  Result Value Ref Range   BUN 11 4 - 21   Creatinine 0.7 0.6 - 1.3   Potassium 4.3 3.4 - 5.3  Comprehensive metabolic panel     Status: Abnormal   Collection Time: 07/05/20 12:00 AM  Result Value Ref Range   GFR calc Af Amer 117    GFR calc non Af Amer 101    Calcium 8.6 (A) 8.7 - 10.7  TSH     Status: None   Collection Time: 07/05/20 12:00 AM  Result Value Ref Range   TSH 5.19 0.41 - 5.90    Comment: FREE T4- 0.80  Hemoglobin A1c     Status: None   Collection Time: 07/05/20 12:00 AM  Result Value Ref Range   Hemoglobin A1C 8.0    Lipid Panel     Component Value Date/Time   CHOL 95 11/08/2019 0000   TRIG 133 11/08/2019 0000   HDL 22 (A) 11/08/2019 0000   LDLCALC 51 11/08/2019 0000    Assessment & Plan:   1. Uncontrolled type 2 diabetes mellitus with complications including coronary artery disease: He wears a CGM device: His printouts show 60% time in range, 38% above range.  No hypoglycemia.  His previsit labs show A1c of 8%, overall improving from 10.2%.  He is responding to the U500 insulin.  He remains off of his Metformin and glipizide as well as Bydureon.  -He recently developed coronary disease which required stent placement  x2, he remains at extremely high risk for acute and chronic complications of diabetes which include CAD, CVA, CKD, retinopathy, and neuropathy. These are all discussed in detail with the patient.   Recent labs reviewed, showing normal renal function.  - I have re-counseled the patient on diet management and weight loss  by adopting a carbohydrate restricted / protein rich  Diet.  - he  admits there is a room for improvement in his diet and drink choices. -  Suggestion is made for him to avoid simple carbohydrates  from his diet including Cakes, Sweet Desserts / Pastries, Ice Cream, Soda (diet and regular), Sweet Tea, Candies, Chips, Cookies, Sweet Pastries,  Store Bought Juices, Alcohol in Excess of  1-2 drinks a day,  Artificial Sweeteners, Coffee Creamer, and "Sugar-free" Products. This will help patient to have stable blood glucose profile and potentially avoid unintended weight gain.   - Patient is advised to stick to a routine mealtimes to eat 3 meals  a day and avoid unnecessary snacks ( to snack only to correct hypoglycemia).   - I have approached patient with the following individualized plan to manage diabetes and patient agrees.   He will is responding to the U500 insulin better is on [place] and Humalog.  He is advised to increase his Humulin U50 0 to 60 units 3 times a day when premeal blood glucose readings are above 90 mg per DL.  He is advised to use his CGM at least 4 times a day-before meals and at bedtime.   -Patient is encouraged to call clinic for blood glucose levels less than 70 or above 200 mg /dl. -He is advised to resume Metformin 500 mg ER daily after breakfast . - Patient specific target  for A1c; LDL, HDL, Triglycerides, and  Waist Circumference were discussed in detail.  2) BP/HTN:  -His blood pressure is controlled to target.  He is advised to continue  his blood pressure medications including carvedilol.   3) Lipids/HPL: His recent lipid panel showed controlled  LDL at 77.  He is advised to continue Crestor 40 mg p.o. nightly.  Side effects and precautions discussed with him.      4)  Weight/Diet: His BMI 31.7 -he is a candidate for weight loss.  I discussed the fact that 5-10% of current body weight loss will impact his diabetes significantly.  CDE consult in progress, exercise, and carbohydrates information provided.  5) erectile dysfunction: He is benefiting from Viagra therapy.  6) Chronic Care/Health Maintenance: -Patient is  on  medications and encouraged to continue to follow up with Ophthalmology, Podiatrist at least yearly or according to recommendations, and advised to  stay away from smoking. I have recommended yearly flu vaccine and pneumonia vaccination at least every 5 years; moderate intensity exercise for up to 150 minutes weekly; and  sleep for at least 7 hours a day.   - I advised patient to maintain close follow up with his PCP for primary care needs.  - Time spent on this patient care encounter:  35 min, of which > 50% was spent in  counseling and the rest reviewing his blood glucose logs , discussing his hypoglycemia and hyperglycemia episodes, reviewing his current and  previous labs / studies  ( including abstraction from other facilities) and medications  doses and developing a  long term treatment plan and documenting his care.   Please refer to Patient Instructions for Blood Glucose Monitoring and Insulin/Medications Dosing Guide"  in media tab for additional information. Please  also refer to " Patient Self Inventory" in the Media  tab for reviewed elements of pertinent patient history.  George White participated in the discussions, expressed understanding, and voiced agreement with the above plans.  All questions were answered to his satisfaction. he is encouraged to contact clinic should he have any questions or concerns prior to his return visit.   Follow up plan: Return in about 4 months (around 11/10/2020) for F/U with  Meter and Logs Only - no Labs, NV A1c in Office.  Marquis Lunch, MD Phone: [phone]  Fax: [phone]   This note was partially dictated with voice recognition software. Similar sounding words can be transcribed inadequately or may not  be corrected upon review.  07/11/2020, 5:26 PM

## 2020-07-11 NOTE — Patient Instructions (Signed)

## 2020-08-28 ENCOUNTER — Other Ambulatory Visit: Payer: Self-pay | Admitting: "Endocrinology

## 2020-09-07 ENCOUNTER — Other Ambulatory Visit: Payer: Self-pay | Admitting: "Endocrinology

## 2020-09-07 MED ORDER — OZEMPIC (1 MG/DOSE) 2 MG/1.5ML ~~LOC~~ SOPN: 1.0000 mg | PEN_INJECTOR | SUBCUTANEOUS | 2 refills | Status: DC

## 2020-10-03 ENCOUNTER — Other Ambulatory Visit: Payer: Self-pay | Admitting: "Endocrinology

## 2020-11-14 ENCOUNTER — Ambulatory Visit: Payer: BC Managed Care – PPO | Admitting: "Endocrinology

## 2020-11-24 ENCOUNTER — Other Ambulatory Visit: Payer: Self-pay

## 2020-11-24 ENCOUNTER — Encounter: Payer: Self-pay | Admitting: "Endocrinology

## 2020-11-24 ENCOUNTER — Ambulatory Visit: Payer: BC Managed Care – PPO | Admitting: "Endocrinology

## 2020-11-24 VITALS — BP 134/79 | HR 85 | Ht 73.0 in | Wt 252.2 lb

## 2020-11-24 DIAGNOSIS — E1159 Type 2 diabetes mellitus with other circulatory complications: Secondary | ICD-10-CM | POA: Diagnosis not present

## 2020-11-24 LAB — POCT GLYCOSYLATED HEMOGLOBIN (HGB A1C): HbA1c, POC (controlled diabetic range): 7.5 % — AB (ref 0.0–7.0)

## 2020-11-24 MED ORDER — HUMULIN R U-500 KWIKPEN 500 UNIT/ML ~~LOC~~ SOPN: 70.0000 [IU] | PEN_INJECTOR | Freq: Three times a day (TID) | SUBCUTANEOUS | 2 refills | Status: AC

## 2020-11-24 NOTE — Progress Notes (Signed)
11/24/2020  Endocrinology follow-up note   Subjective:    Patient ID: George White, male    DOB: Apr 16, 1961,    Past Medical History:  Diagnosis Date  . Diabetes mellitus, type II (HCC)   . Heart attack (HCC) 02/23/2020   Multiple stents placed  . Hyperlipidemia    Past Surgical History:  Procedure Laterality Date  . KNEE SURGERY    . ROTATOR CUFF REPAIR     Social History   Socioeconomic History  . Marital status: Married    Spouse name: Not on file  . Number of children: 1  . Years of education: Not on file  . Highest education level: Not on file  Occupational History  . Occupation: EMPLOYED  Tobacco Use  . Smoking status: Never Smoker  . Smokeless tobacco: Never Used  Vaping Use  . Vaping Use: Never used  Substance and Sexual Activity  . Alcohol use: Yes    Alcohol/week: 0.0 standard drinks    Comment: occ  . Drug use: No  . Sexual activity: Not on file  Other Topics Concern  . Not on file  Social History Narrative  . Not on file   Social Determinants of Health   Financial Resource Strain: Low Risk   . Difficulty of Paying Living Expenses: Not hard at all  Food Insecurity: No Food Insecurity  . Worried About Programme researcher, broadcasting/film/video in the Last Year: Never true  . Ran Out of Food in the Last Year: Never true  Transportation Needs: No Transportation Needs  . Lack of Transportation (Medical): No  . Lack of Transportation (Non-Medical): No  Physical Activity: Inactive  . Days of Exercise per Week: 0 days  . Minutes of Exercise per Session: 0 min  Stress: No Stress Concern Present  . Feeling of Stress : Not at all  Social Connections: Moderately Integrated  . Frequency of Communication with Friends and Family: More than three times a week  . Frequency of Social Gatherings with Friends and Family: Three times a week  . Attends Religious Services: Never  . Active Member of Clubs or Organizations: Yes  .  Attends Banker Meetings: More than 4 times per year  . Marital Status: Married   Outpatient Encounter Medications as of 11/24/2020  Medication Sig  . Risankizumab-rzaa (SKYRIZI PEN ) Inject into the skin every 3 (three) months.  Marland Kitchen aspirin 81 MG EC tablet Take 1 tablet by mouth daily.  . carvedilol (COREG) 3.125 MG tablet Take 3.125 mg by mouth 2 (two) times daily with a meal.  . cetirizine (ZYRTEC) 10 MG chewable tablet Chew 10 mg by mouth as needed for allergies.  . Continuous Blood Gluc Sensor (FREESTYLE LIBRE 14 DAY SENSOR) MISC INJECT 1 INTO THE SKIN ONCE EVERY 14 DAYS AS DIRECTED  . dicyclomine (BENTYL) 20 MG tablet Take 1 tablet (20 mg total) by mouth 3 (three) times daily as needed for spasms (or diarrhea).  . Insulin Pen Needle (ULTICARE MICRO PEN NEEDLES) 32G X 4 MM MISC 1 Device by Other route in the morning, at noon, in the evening, and at bedtime. qid  . insulin regular human CONCENTRATED (HUMULIN R U-500 KWIKPEN) 500 UNIT/ML kwikpen Inject 70  Units into the skin 3 (three) times daily with meals.  Marland Kitchen lisinopril (ZESTRIL) 5 MG tablet Take 5 mg by mouth daily.  . metFORMIN (GLUCOPHAGE-XR) 500 MG 24 hr tablet TAKE 1 TABLET BY MOUTH EVERY DAY WITH BREAKFAST  . prasugrel (EFFIENT) 10 MG TABS tablet Take 10 mg by mouth daily.  . rosuvastatin (CRESTOR) 10 MG tablet Take 10 mg by mouth at bedtime.  . Semaglutide, 1 MG/DOSE, (OZEMPIC, 1 MG/DOSE,) 2 MG/1.5ML SOPN Inject 1 mg into the skin once a week.  . sildenafil (VIAGRA) 50 MG tablet TAKE 1 TABLET BY MOUTH AS DIRECTED  . [DISCONTINUED] insulin regular human CONCENTRATED (HUMULIN R U-500 KWIKPEN) 500 UNIT/ML kwikpen Inject 60 Units into the skin 3 (three) times daily with meals.   No facility-administered encounter medications on file as of 11/24/2020.   ALLERGIES: No Known Allergies VACCINATION STATUS: Immunization History  Administered Date(s) Administered  . Moderna Sars-Covid-2 Vaccination 10/26/2019, 11/24/2019     Diabetes He presents for his follow-up diabetic visit. He has type 2 diabetes mellitus. Onset time: He was diagnosed at approximate age of 29 years. His disease course has been improving (Since his last visit he did have coronary artery disease which required 2 stent placements.). There are no hypoglycemic associated symptoms. Pertinent negatives for hypoglycemia include no confusion, headaches, pallor or seizures. Pertinent negatives for diabetes include no chest pain, no fatigue, no polydipsia, no polyphagia, no polyuria and no weakness. There are no hypoglycemic complications. Symptoms are improving. Diabetic complications include heart disease and impotence. Risk factors for coronary artery disease include diabetes mellitus, dyslipidemia, hypertension, male sex, sedentary lifestyle and obesity. Current diabetic treatment includes insulin injections and oral agent (dual therapy) (As well as metformin and Invokana.). He is compliant with treatment most of the time. His weight is fluctuating minimally. He is following a generally unhealthy diet. When asked about meal planning, he reported none. He participates in exercise intermittently. His home blood glucose trend is decreasing steadily. His breakfast blood glucose range is generally 140-180 mg/dl. His lunch blood glucose range is generally 140-180 mg/dl. His dinner blood glucose range is generally 140-180 mg/dl. His bedtime blood glucose range is generally 140-180 mg/dl. His overall blood glucose range is 140-180 mg/dl. (He continues to wear his freestyle libre CGM device.  His printouts show 65% time range, 25% above range.  He has no significant hypoglycemia.  His point-of-care A1c 7.5%, improving from 10.2%.    ) An ACE inhibitor/angiotensin II receptor blocker is not being taken.  Hyperlipidemia This is a chronic problem. The current episode started more than 1 year ago. The problem is uncontrolled. Exacerbating diseases include diabetes and  obesity. Pertinent negatives include no chest pain, myalgias or shortness of breath. Current antihyperlipidemic treatment includes statins. Risk factors for coronary artery disease include diabetes mellitus, dyslipidemia, hypertension, a sedentary lifestyle, obesity and male sex.  Hypertension This is a chronic problem. The current episode started more than 1 year ago. Pertinent negatives include no chest pain, headaches, neck pain, palpitations or shortness of breath. Risk factors for coronary artery disease include dyslipidemia, diabetes mellitus, obesity and sedentary lifestyle.    Review of systems Limited as above.   Objective:    BP 134/79   Pulse 85   Ht 6\' 1"  (1.854 m)   Wt 252 lb 3.2 oz (114.4 kg)   BMI 33.27 kg/m   Wt Readings from Last 3 Encounters:  11/24/20 252 lb 3.2 oz (114.4 kg)  07/11/20 240 lb 9.6 oz (109.1 kg)  05/26/20 (!) 228 lb 14.4 oz (103.8 kg)       Physical Exam- Limited  Constitutional:  Body mass index is 33.27 kg/m. , not in acute distress, normal state of mind     Recent Results (from the past 2160 hour(s))  HgB A1c     Status: Abnormal   Collection Time: 11/24/20 10:49 AM  Result Value Ref Range   Hemoglobin A1C     HbA1c POC (<> result, manual entry)     HbA1c, POC (prediabetic range)     HbA1c, POC (controlled diabetic range) 7.5 (A) 0.0 - 7.0 %   Lipid Panel     Component Value Date/Time   CHOL 95 11/08/2019 0000   TRIG 133 11/08/2019 0000   HDL 22 (A) 11/08/2019 0000   LDLCALC 51 11/08/2019 0000    Assessment & Plan:   1. Uncontrolled type 2 diabetes mellitus with complications including coronary artery disease:  He continues to wear his freestyle libre CGM device.  His printouts show 65% time range, 25% above range.  He has no significant hypoglycemia.  His point-of-care A1c 7.5%, improving from 10.2%.   -He recently developed coronary disease which required stent placement x2, he remains at extremely high risk for acute and  chronic complications of diabetes which include CAD, CVA, CKD, retinopathy, and neuropathy. These are all discussed in detail with the patient.   Recent labs reviewed, showing normal renal function.  - I have re-counseled the patient on diet management and weight loss  by adopting a carbohydrate restricted / protein rich  Diet.  - he acknowledges that there is a room for improvement in his food and drink choices. - Suggestion is made for him to avoid simple carbohydrates  from his diet including Cakes, Sweet Desserts, Ice Cream, Soda (diet and regular), Sweet Tea, Candies, Chips, Cookies, Store Bought Juices, Alcohol in Excess of  1-2 drinks a day, Artificial Sweeteners,  Coffee Creamer, and "Sugar-free" Products, Lemonade. This will help patient to have more stable blood glucose profile and potentially avoid unintended weight gain.   - Patient is advised to stick to a routine mealtimes to eat 3 meals  a day and avoid unnecessary snacks ( to snack only to correct hypoglycemia).   - I have approached patient with the following individualized plan to manage diabetes and patient agrees.   He continues to respond to the U500 insulin better is done with Tresiba/Humalog.    -He is advised to continue  Humulin U500  70 units 3 times a day when premeal blood glucose readings are above 90 mg per DL.  He is advised to use his CGM at least 4 times a day-before meals and at bedtime.   -Patient is encouraged to call clinic for blood glucose levels less than 70 or above 200 mg /dl. -He continues to tolerate low-dose Metformin, advised to continue Metformin 500 mg ER daily after breakfast .  - Patient specific target  for A1c; LDL, HDL, Triglycerides, and  Waist Circumference were discussed in detail.  2) BP/HTN:  -His blood pressure is controlled to target.  He is advised to continue  his blood pressure medications including carvedilol.   3) Lipids/HPL: His recent lipid panel showed controlled LDL at  77.  He is advised to continue Crestor 40 mg p.o. nightly.   Side effects and precautions discussed with him.      4)  Weight/Diet: His BMI 33.27 -he is a candidate for weight loss.  I discussed the  fact that 5-10% of current body weight loss will impact his diabetes significantly.  CDE consult in progress, exercise, and carbohydrates information provided.  5) erectile dysfunction: He is benefiting from Viagra therapy.  6) Chronic Care/Health Maintenance: -Patient is  on  medications and encouraged to continue to follow up with Ophthalmology, Podiatrist at least yearly or according to recommendations, and advised to  stay away from smoking. I have recommended yearly flu vaccine and pneumonia vaccination at least every 5 years; moderate intensity exercise for up to 150 minutes weekly; and  sleep for at least 7 hours a day.  POCT ABI Results 11/24/20  His ABIs normal today. Right ABI: 1.28      left ABI: 1.29  Right leg systolic / diastolic: 171/86 mmHg Left leg systolic / diastolic: 173/88 mmHg  Arm systolic / diastolic: 134/79 mmHG  The study will be repeated in January 2027, or sooner if needed.   - I advised patient to maintain close follow up with his PCP for primary care needs.  - Time spent on this patient care encounter:  35 min, of which > 50% was spent in  counseling and the rest reviewing his blood glucose logs , discussing his hypoglycemia and hyperglycemia episodes, reviewing his current and  previous labs / studies  ( including abstraction from other facilities) and medications  doses and developing a  long term treatment plan and documenting his care.   Please refer to Patient Instructions for Blood Glucose Monitoring and Insulin/Medications Dosing Guide"  in media tab for additional information. Please  also refer to " Patient Self Inventory" in the Media  tab for reviewed elements of pertinent patient history.  George White participated in the discussions, expressed  understanding, and voiced agreement with the above plans.  All questions were answered to his satisfaction. he is encouraged to contact clinic should he have any questions or concerns prior to his return visit.    Follow up plan: Return in about 4 months (around 03/24/2021) for F/U with Pre-visit Labs, Meter, Logs, A1c here.Marquis Lunch, MD Phone: [phone]  Fax: [phone]   This note was partially dictated with voice recognition software. Similar sounding words can be transcribed inadequately or may not  be corrected upon review.  11/24/2020, 11:23 AM

## 2020-11-24 NOTE — Patient Instructions (Signed)

## 2020-11-30 ENCOUNTER — Inpatient Hospital Stay (HOSPITAL_COMMUNITY): Payer: BC Managed Care – PPO | Attending: Hematology

## 2020-11-30 ENCOUNTER — Ambulatory Visit (HOSPITAL_COMMUNITY)
Admission: RE | Admit: 2020-11-30 | Discharge: 2020-11-30 | Disposition: A | Discharge status: Home / Self Care | Payer: BC Managed Care – PPO | Source: Ambulatory Visit | Attending: Hematology | Admitting: Hematology

## 2020-11-30 ENCOUNTER — Other Ambulatory Visit: Payer: Self-pay

## 2020-11-30 DIAGNOSIS — Z8249 Family history of ischemic heart disease and other diseases of the circulatory system: Secondary | ICD-10-CM | POA: Diagnosis not present

## 2020-11-30 DIAGNOSIS — R911 Solitary pulmonary nodule: Secondary | ICD-10-CM | POA: Diagnosis present

## 2020-11-30 DIAGNOSIS — I252 Old myocardial infarction: Secondary | ICD-10-CM | POA: Insufficient documentation

## 2020-11-30 DIAGNOSIS — D649 Anemia, unspecified: Secondary | ICD-10-CM | POA: Insufficient documentation

## 2020-11-30 DIAGNOSIS — I2510 Atherosclerotic heart disease of native coronary artery without angina pectoris: Secondary | ICD-10-CM | POA: Insufficient documentation

## 2020-11-30 DIAGNOSIS — Z794 Long term (current) use of insulin: Secondary | ICD-10-CM | POA: Insufficient documentation

## 2020-11-30 DIAGNOSIS — E279 Disorder of adrenal gland, unspecified: Secondary | ICD-10-CM | POA: Insufficient documentation

## 2020-11-30 DIAGNOSIS — Z79899 Other long term (current) drug therapy: Secondary | ICD-10-CM | POA: Insufficient documentation

## 2020-11-30 DIAGNOSIS — Z7982 Long term (current) use of aspirin: Secondary | ICD-10-CM | POA: Diagnosis not present

## 2020-11-30 DIAGNOSIS — Z8042 Family history of malignant neoplasm of prostate: Secondary | ICD-10-CM | POA: Diagnosis not present

## 2020-11-30 DIAGNOSIS — E785 Hyperlipidemia, unspecified: Secondary | ICD-10-CM | POA: Diagnosis not present

## 2020-11-30 DIAGNOSIS — E119 Type 2 diabetes mellitus without complications: Secondary | ICD-10-CM | POA: Insufficient documentation

## 2020-11-30 LAB — CBC WITH DIFFERENTIAL/PLATELET
Abs Immature Granulocytes: 0.04 10*3/uL (ref 0.00–0.07)
Basophils Absolute: 0 10*3/uL (ref 0.0–0.1)
Basophils Relative: 0 %
Eosinophils Absolute: 0.1 10*3/uL (ref 0.0–0.5)
Eosinophils Relative: 1 %
HCT: 43.2 % (ref 39.0–52.0)
Hemoglobin: 14 g/dL (ref 13.0–17.0)
Immature Granulocytes: 1 %
Lymphocytes Relative: 20 %
Lymphs Abs: 1.7 10*3/uL (ref 0.7–4.0)
MCH: 30.6 pg (ref 26.0–34.0)
MCHC: 32.4 g/dL (ref 30.0–36.0)
MCV: 94.3 fL (ref 80.0–100.0)
Monocytes Absolute: 0.6 10*3/uL (ref 0.1–1.0)
Monocytes Relative: 6 %
Neutro Abs: 6.4 10*3/uL (ref 1.7–7.7)
Neutrophils Relative %: 72 %
Platelets: 252 10*3/uL (ref 150–400)
RBC: 4.58 MIL/uL (ref 4.22–5.81)
RDW: 12.3 % (ref 11.5–15.5)
WBC: 8.9 10*3/uL (ref 4.0–10.5)
nRBC: 0 % (ref 0.0–0.2)

## 2020-11-30 LAB — CORTISOL: Cortisol, Plasma: 5.5 ug/dL

## 2020-11-30 MED ORDER — IOHEXOL 9 MG/ML PO SOLN
ORAL | Status: AC
  Filled 2020-11-30: qty 1000

## 2020-12-01 ENCOUNTER — Other Ambulatory Visit: Payer: Self-pay | Admitting: "Endocrinology

## 2020-12-04 ENCOUNTER — Inpatient Hospital Stay (HOSPITAL_COMMUNITY): Payer: BC Managed Care – PPO | Admitting: Hematology

## 2020-12-04 ENCOUNTER — Other Ambulatory Visit: Payer: Self-pay

## 2020-12-04 VITALS — BP 130/69 | HR 89 | Temp 98.4°F | Resp 17 | Wt 248.2 lb

## 2020-12-04 DIAGNOSIS — R911 Solitary pulmonary nodule: Secondary | ICD-10-CM | POA: Diagnosis not present

## 2020-12-04 DIAGNOSIS — E278 Other specified disorders of adrenal gland: Secondary | ICD-10-CM | POA: Diagnosis not present

## 2020-12-04 NOTE — Patient Instructions (Signed)
[facility] at [facility] Discharge Instructions  You were seen today by Dr. Ellin Saba. He went over your recent results and scans. You will be scheduled for a CT scan of your chest before your next visit. Dr. Ellin Saba will see you back after the scan for labs and follow up.   Thank you for choosing [facility] at [facility] to provide your oncology and hematology care.  To afford each patient quality time with our provider, please arrive at least 15 minutes before your scheduled appointment time.   If you have a lab appointment with the Cancer Center please come in thru the Main Entrance and check in at the main information desk  You need to re-schedule your appointment should you arrive 10 or more minutes late.  We strive to give you quality time with our providers, and arriving late affects you and other patients whose appointments are after yours.  Also, if you no show three or more times for appointments you may be dismissed from the clinic at the providers discretion.     Again, thank you for choosing [facility].  Our hope is that these requests will decrease the amount of time that you wait before being seen by our physicians.       _____________________________________________________________  Should you have questions after your visit to [facility], please contact our office at [phone] between the hours of 8:00 a.m. and 4:30 p.m.  Voicemails left after 4:00 p.m. will not be returned until the following business day.  For prescription refill requests, have your pharmacy contact our office and allow 72 hours.    Cancer Center Support Programs:   > Cancer Support Group  2nd Tuesday of the month 1pm-2pm, Journey Room

## 2020-12-04 NOTE — Progress Notes (Signed)
[facility] [address], [place] [address]   CLINIC:  Medical Oncology/Hematology  PCP:  Dorice Lamas, MD [address] / [place] [address] [phone]   REASON FOR VISIT:  Follow-up for left adrenal nodule and right lung nodule  PRIOR THERAPY: None  NGS Results: Not done  CURRENT THERAPY: Observation  BRIEF ONCOLOGIC HISTORY:  Oncology History   No history exists.    CANCER STAGING: Cancer Staging No matching staging information was found for the patient.  INTERVAL HISTORY:  George White, a 60 y.o. male, returns for routine follow-up of his left adrenal nodule and right lung nodule. George White was last seen on 05/26/2020.   Today he is accompanied by his wife and he reports feeling well. He denies having any issues with controlling his BP. He denies having abdominal cramping or diarrhea. He continues taking Effient and denies having hematochezia or hematuria. He is still on Humulin and denies having any excessive weight gain.   REVIEW OF SYSTEMS:  Review of Systems  Constitutional: Positive for fatigue (90%). Negative for appetite change and unexpected weight change.  Gastrointestinal: Negative for abdominal pain, blood in stool and diarrhea.  Genitourinary: Negative for hematuria.   Musculoskeletal: Positive for arthralgias (6/10 knee pain).  All other systems reviewed and are negative.   PAST MEDICAL/SURGICAL HISTORY:  Past Medical History:  Diagnosis Date  . Diabetes mellitus, type II (HCC)   . Heart attack (HCC) 02/23/2020   Multiple stents placed  . Hyperlipidemia    Past Surgical History:  Procedure Laterality Date  . KNEE SURGERY    . ROTATOR CUFF REPAIR      SOCIAL HISTORY:  Social History   Socioeconomic History  . Marital status: Married    Spouse name: Not on file  . Number of children: 1  . Years of education: Not on file  . Highest education level: Not on file  Occupational History   . Occupation: EMPLOYED  Tobacco Use  . Smoking status: Never Smoker  . Smokeless tobacco: Never Used  Vaping Use  . Vaping Use: Never used  Substance and Sexual Activity  . Alcohol use: Yes    Alcohol/week: 0.0 standard drinks    Comment: occ  . Drug use: No  . Sexual activity: Not on file  Other Topics Concern  . Not on file  Social History Narrative  . Not on file   Social Determinants of Health   Financial Resource Strain: Low Risk   . Difficulty of Paying Living Expenses: Not hard at all  Food Insecurity: No Food Insecurity  . Worried About Programme researcher, broadcasting/film/video in the Last Year: Never true  . Ran Out of Food in the Last Year: Never true  Transportation Needs: No Transportation Needs  . Lack of Transportation (Medical): No  . Lack of Transportation (Non-Medical): No  Physical Activity: Inactive  . Days of Exercise per Week: 0 days  . Minutes of Exercise per Session: 0 min  Stress: No Stress Concern Present  . Feeling of Stress : Not at all  Social Connections: Moderately Integrated  . Frequency of Communication with Friends and Family: More than three times a week  . Frequency of Social Gatherings with Friends and Family: Three times a week  . Attends Religious Services: Never  . Active Member of Clubs or Organizations: Yes  . Attends Banker Meetings: More than 4 times per year  . Marital Status: Married  Intimate Partner Violence: Not At Risk  . Fear of Current or Ex-Partner: No  . Emotionally Abused: No  . Physically Abused: No  . Sexually Abused: No    FAMILY HISTORY:  Family History  Problem Relation Age of Onset  . Ulcers Father   . Prostate cancer Father   . Dementia Maternal Grandmother   . Heart attack Paternal Grandmother   . Stroke Paternal Grandfather   . Colon cancer Neg Hx   . Gastric cancer Neg Hx   . Esophageal cancer Neg Hx     CURRENT MEDICATIONS:  Current Outpatient Medications  Medication Sig Dispense Refill  . aspirin  81 MG EC tablet Take 1 tablet by mouth daily.    . carvedilol (COREG) 3.125 MG tablet Take 3.125 mg by mouth 2 (two) times daily with a meal.    . cetirizine (ZYRTEC) 10 MG chewable tablet Chew 10 mg by mouth as needed for allergies.    . Continuous Blood Gluc Sensor (FREESTYLE LIBRE 14 DAY SENSOR) MISC INJECT 1 INTO THE SKIN ONCE EVERY 14 DAYS AS DIRECTED 2 each 2  . dicyclomine (BENTYL) 20 MG tablet Take 1 tablet (20 mg total) by mouth 3 (three) times daily as needed for spasms (or diarrhea). 30 tablet 2  . Insulin Pen Needle (ULTICARE MICRO PEN NEEDLES) 32G X 4 MM MISC 1 Device by Other route in the morning, at noon, in the evening, and at bedtime. qid 200 each 3  . insulin regular human CONCENTRATED (HUMULIN R U-500 KWIKPEN) 500 UNIT/ML kwikpen Inject 70 Units into the skin 3 (three) times daily with meals. 18 mL 2  . levothyroxine (SYNTHROID) 25 MCG tablet Take 25 mcg by mouth daily.    Marland Kitchen lisinopril (ZESTRIL) 5 MG tablet Take 5 mg by mouth daily.    . metFORMIN (GLUCOPHAGE-XR) 500 MG 24 hr tablet TAKE 1 TABLET BY MOUTH EVERY DAY WITH BREAKFAST 90 tablet 1  . prasugrel (EFFIENT) 10 MG TABS tablet Take 10 mg by mouth daily.    . ranolazine (RANEXA) 500 MG 12 hr tablet Take 500 mg by mouth 2 (two) times daily.    George White (SKYRIZI PEN [place]) Inject into the skin every 3 (three) months.    . rosuvastatin (CRESTOR) 10 MG tablet Take 10 mg by mouth at bedtime.    . Semaglutide, 1 MG/DOSE, (OZEMPIC, 1 MG/DOSE,) 4 MG/3ML SOPN Inject 1 mg into the skin once a week. 9 mL 1  . sildenafil (VIAGRA) 50 MG tablet TAKE 1 TABLET BY MOUTH AS DIRECTED 6 tablet 1   No current facility-administered medications for this visit.    ALLERGIES:  No Known Allergies  PHYSICAL EXAM:  Performance status (ECOG): 0 - Asymptomatic  Vitals:   12/04/20 1149  BP: 130/69  Pulse: 89  Resp: 17  Temp: 98.4 F (36.9 C)  SpO2: 98%   Wt Readings from Last 3 Encounters:  12/04/20 248 lb 4 oz (112.6 kg)   11/24/20 252 lb 3.2 oz (114.4 kg)  07/11/20 240 lb 9.6 oz (109.1 kg)   Physical Exam Vitals reviewed.  Constitutional:      Appearance: Normal appearance. He is obese.  Cardiovascular:     Rate and Rhythm: Normal rate and regular rhythm.     Pulses: Normal pulses.     Heart sounds: Normal heart sounds.  Pulmonary:     Effort: Pulmonary effort is normal.     Breath sounds: Normal breath sounds.  Abdominal:     Palpations: Abdomen  is soft. There is no hepatomegaly, splenomegaly or mass.     Tenderness: There is no abdominal tenderness.     Hernia: No hernia is present.  Musculoskeletal:     Right lower leg: No edema.     Left lower leg: No edema.  Neurological:     General: No focal deficit present.     Mental Status: He is alert and oriented to person, place, and time.  Psychiatric:        Mood and Affect: Mood normal.        Behavior: Behavior normal.      LABORATORY DATA:  I have reviewed the labs as listed.  CBC Latest Ref Rng & Units 11/30/2020 05/25/2020 04/27/2020  WBC 4.0 - 10.5 K/uL 8.9 9.1 8.1  Hemoglobin 13.0 - 17.0 g/dL 10.9 12.5(L) 13.0  Hematocrit 39.0 - 52.0 % 43.2 38.6(L) 40.0  Platelets 150 - 400 K/uL 252 221 251   CMP Latest Ref Rng & Units 07/05/2020 05/25/2020 04/27/2020  Glucose 70 - 99 mg/dL - 323(F) 573(U)  BUN 4 - 21 11 8 8   Creatinine 0.6 - 1.3 0.7 0.69 0.75  Sodium 135 - 145 mmol/L - 136 138  Potassium 3.4 - 5.3 4.3 3.6 3.9  Chloride 98 - 111 mmol/L - 103 105  CO2 22 - 32 mmol/L - 26 24  Calcium 8.7 - 10.7 8.6(A) 8.6(L) 8.6(L)  Total Protein 6.5 - 8.1 g/dL - 7.2 -  Total Bilirubin 0.3 - 1.2 mg/dL - 0.3 -  Alkaline Phos 38 - 126 U/L - 83 -  AST 15 - 41 U/L - 23 -  ALT 0 - 44 U/L - 35 -    DIAGNOSTIC IMAGING:  I have independently reviewed the scans and discussed with the patient. CT ADRENAL ABD WO  Result Date: 11/30/2020 CLINICAL DATA:  Follow-up left adrenal mass. EXAM: CT ABDOMEN WITHOUT CONTRAST TECHNIQUE: Multidetector CT imaging of the  abdomen was performed following the standard protocol without IV contrast. COMPARISON:  04/14/2020 FINDINGS: Lower chest: No acute findings. Hepatobiliary: No masses visualized on this unenhanced exam. Gallbladder is unremarkable. No evidence of biliary ductal dilatation. Pancreas: No mass or inflammatory process visualized on this unenhanced exam. Spleen:  Within normal limits in size. Adrenals/Urinary tract: The left adrenal gland has a nodular contour, but no discrete adrenal mass is identified. This is stable in appearance since previous study. This is consistent with mild nodular hyperplasia. No evidence of nephrolithiasis or hydronephrosis. Stomach/Bowel: Visualized portion unremarkable. Vascular/Lymphatic: No pathologically enlarged lymph nodes identified. No evidence of abdominal aortic aneurysm. Aortic atherosclerotic calcification noted. Other:  None. Musculoskeletal:  No suspicious bone lesions identified. 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 No evidence of neoplasm or other significant abnormality. Aortic Atherosclerosis (ICD10-I70.0). Electronically Signed   By: 04/16/2020 M.D.   On: 11/30/2020 11:02     ASSESSMENT:  1. Right lung nodule: -CTAP with contrast on 04/14/2020 showed incidental 6 mm noncalcified lung nodule within the anterior aspect of the right lung base. There was also incidental 9 mm isodense left adrenal mass noted. -Patient was a never smoker. -Family history significant for father with metastatic prostate cancer.  2. Cyclospora cayetanensisinfection: -Recent diarrhea and abdominal cramping with 17 pound weight loss. 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 -He was treated with Bactrim.  3.  CAD: -Recent MI on 02/15/2020 with stents placed.   PLAN:  1. Right lung nodule: -CT chest from 05/25/2020 showed right upper lobe lung nodule. -Recommend  CT chest in July and follow-up after that.  2. CAD: -Continue  Effient, aspirin 81 mg and Crestor daily.  3. Diabetes: -Continue Humulin 50 units 3 times a day.  4.  Left adrenal nodule: -We have reviewed the CT adrenal protocol from 11/30/2020 which showed nodular contour of the left adrenal gland with no discrete adrenal mass identified. -This is stable in appearance since previous study consistent with mild nodular hyperplasia. -Serum cortisol level was normal.  5.  Mild anemia: -This has resolved on the current CBC from 11/30/2020 with hemoglobin 14 and hematocrit 43.   Orders placed this encounter:  No orders of the defined types were placed in this encounter.    Doreatha Massed, MD [facility] [phone]   I, Drue Second, am acting as a scribe for Dr. Payton Mccallum.  I, Doreatha Massed MD, have reviewed the above documentation for accuracy and completeness, and I agree with the above.

## 2020-12-07 ENCOUNTER — Other Ambulatory Visit (HOSPITAL_COMMUNITY): Payer: Self-pay | Admitting: *Deleted

## 2020-12-07 DIAGNOSIS — R911 Solitary pulmonary nodule: Secondary | ICD-10-CM

## 2020-12-28 ENCOUNTER — Other Ambulatory Visit: Payer: Self-pay | Admitting: "Endocrinology

## 2021-03-27 ENCOUNTER — Ambulatory Visit: Payer: BC Managed Care – PPO | Admitting: "Endocrinology

## 2021-05-16 IMAGING — DX DG CHEST 2V
2 series · 2 of 2 positions shown · non-contrast
Comparison: None.

CLINICAL DATA: Chest pain today. Fatigue for the past few days.

EXAM:
CHEST - 2 VIEW

[chest pa]
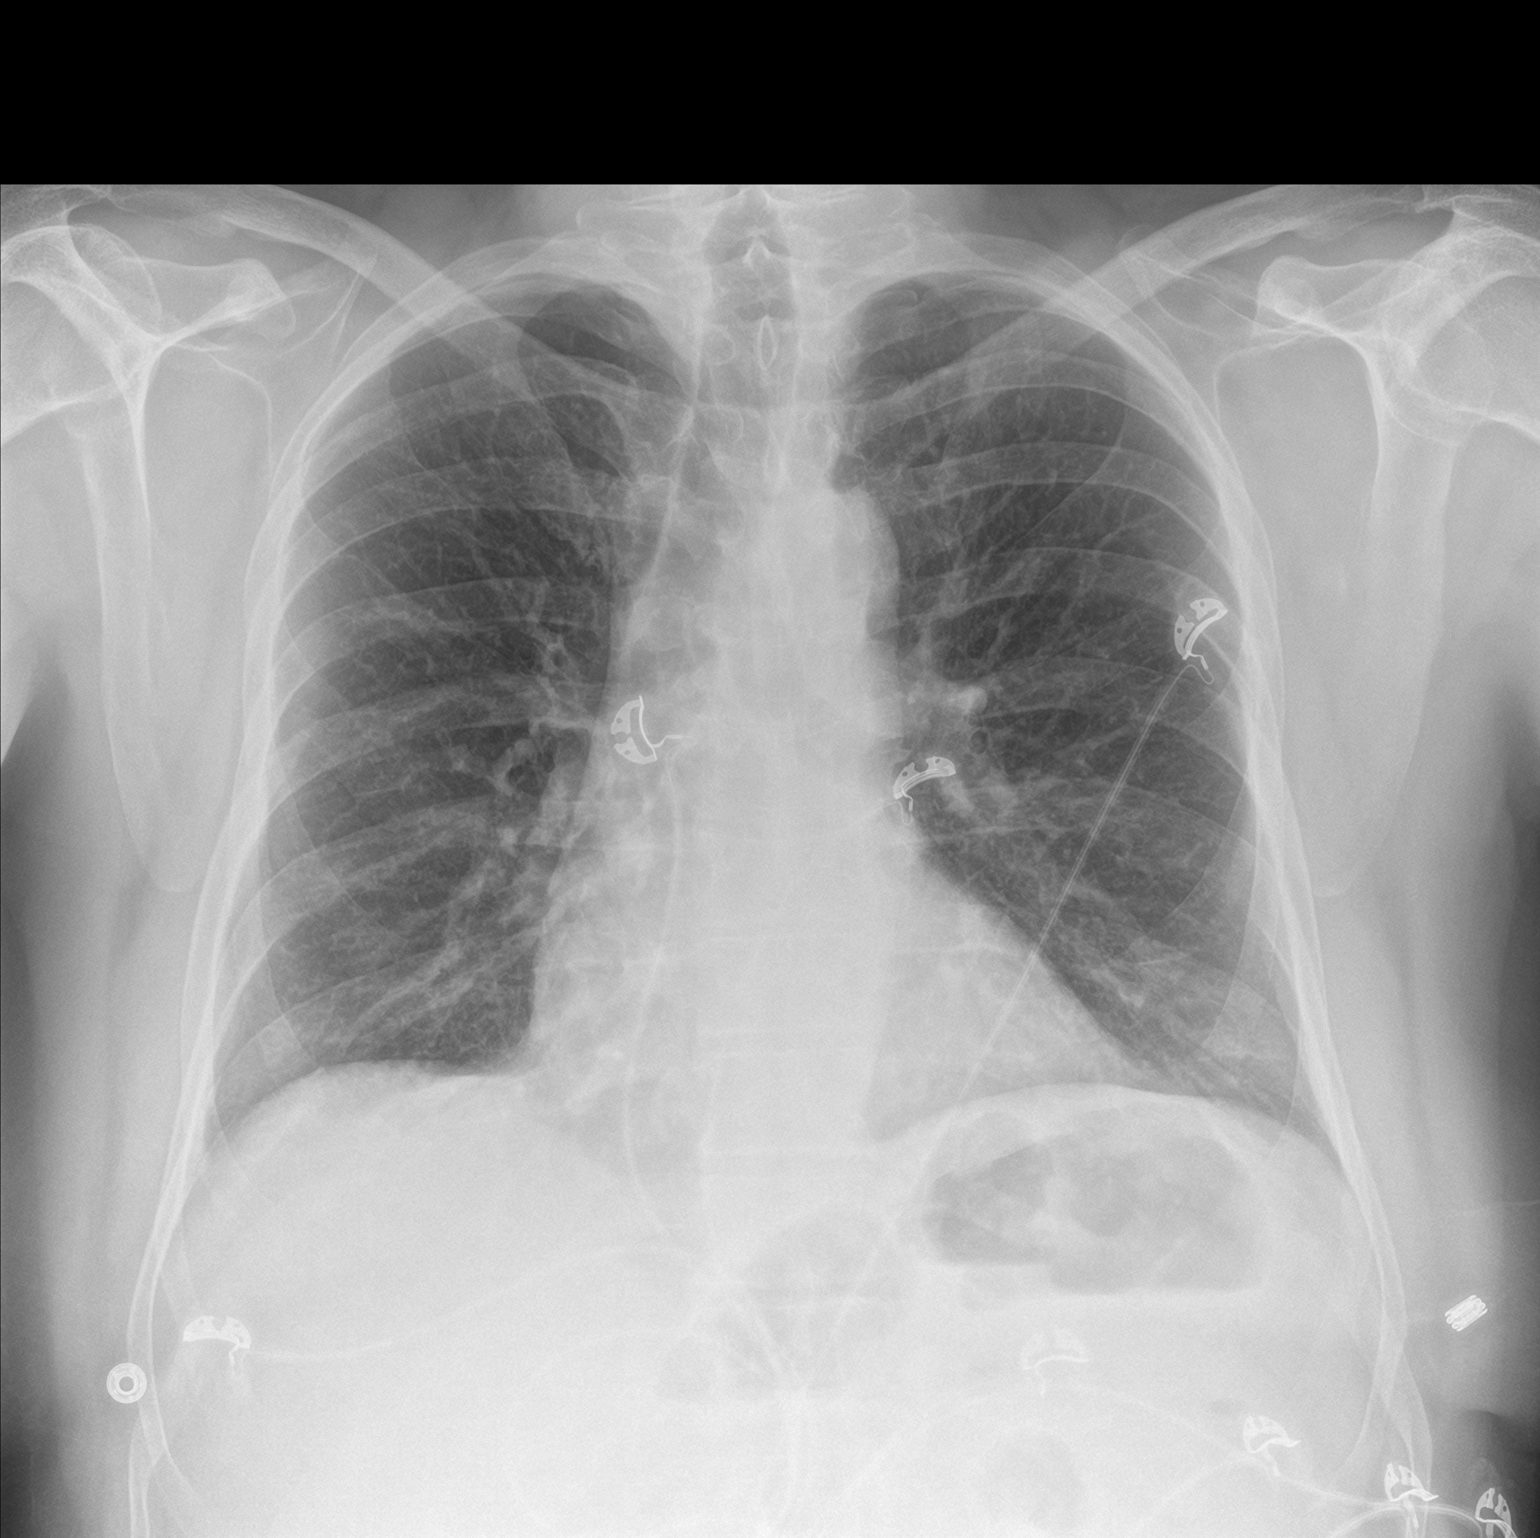

[chest lat]
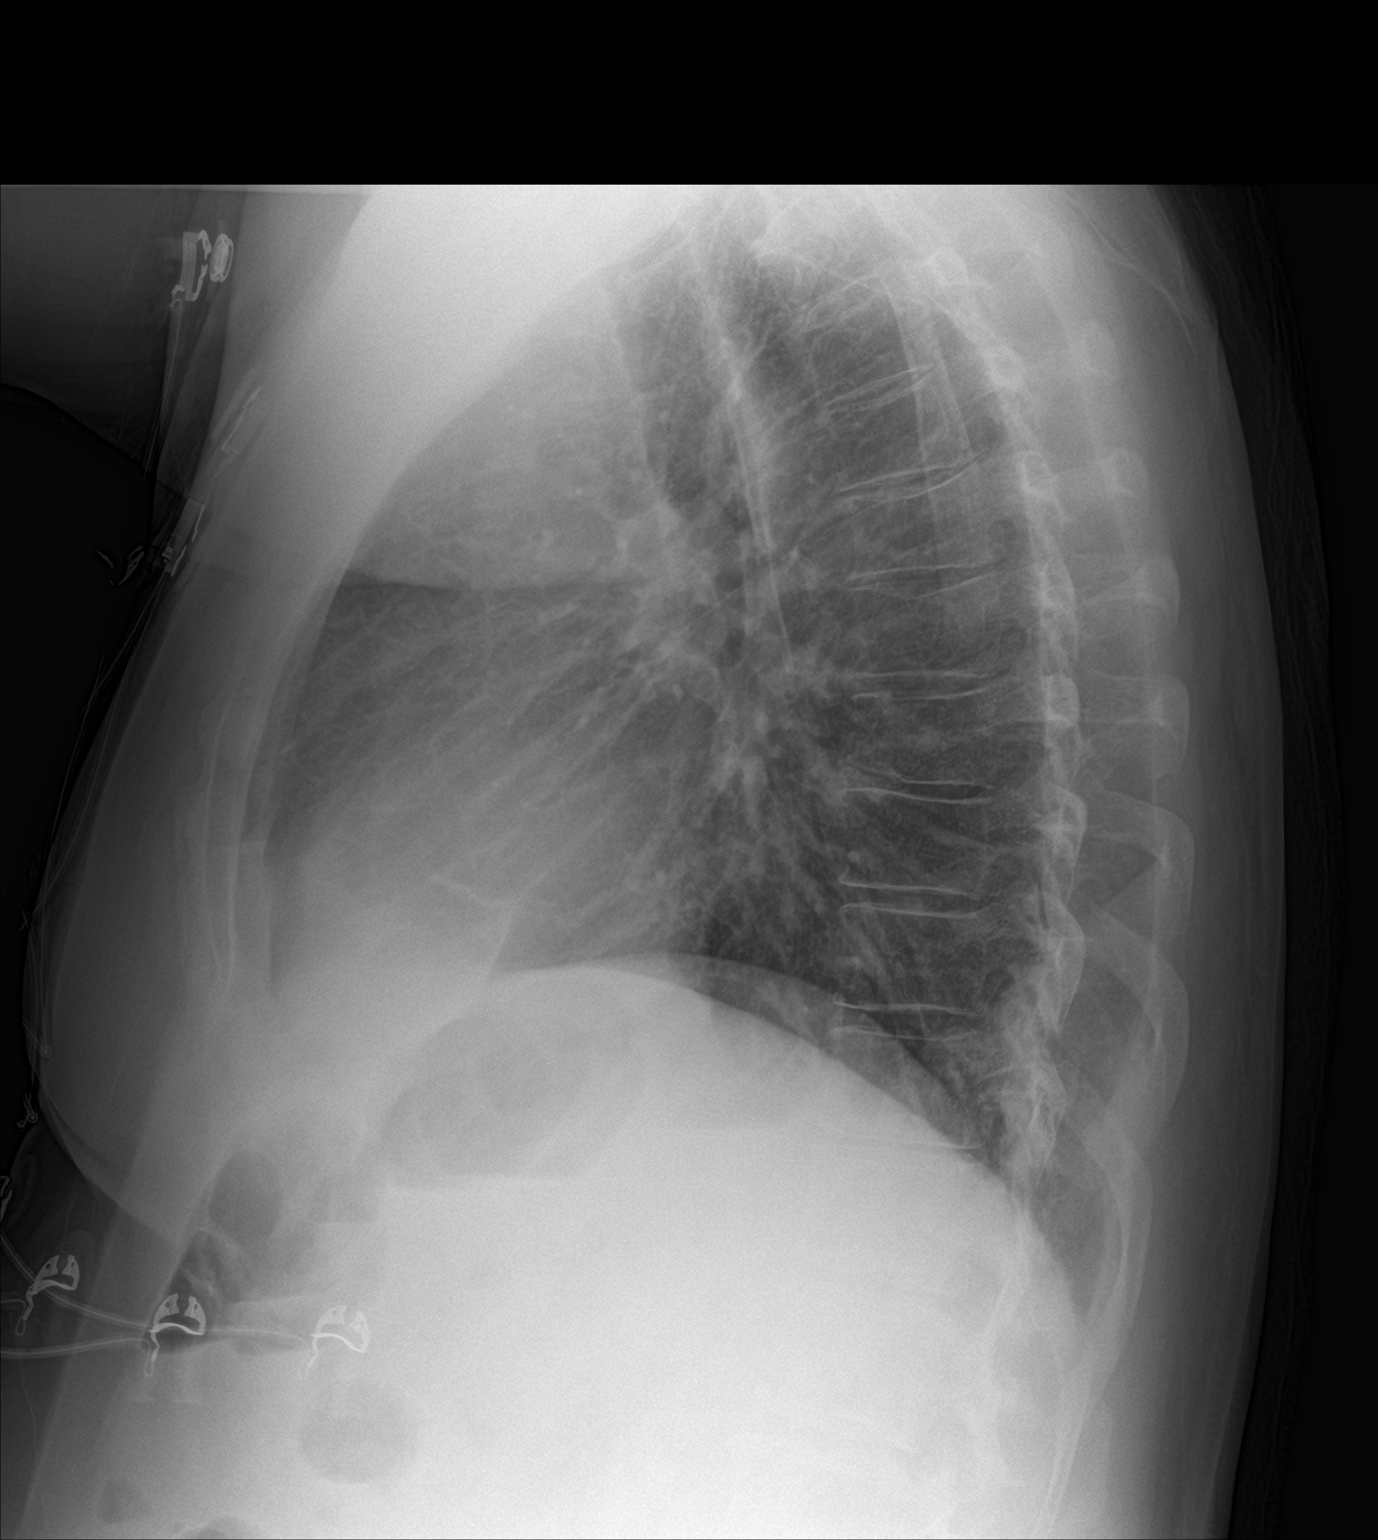

[2 of 2 positions shown; findings below may reference images not displayed]

FINDINGS: Normal sized heart. Mildly tortuous aorta. Clear lungs with normal
vascularity. Mild peribronchial thickening. Unremarkable bones.
IMPRESSION: Mild bronchitic changes.

## 2021-05-30 ENCOUNTER — Ambulatory Visit (HOSPITAL_COMMUNITY): Payer: BC Managed Care – PPO

## 2021-05-30 ENCOUNTER — Inpatient Hospital Stay (HOSPITAL_COMMUNITY): Payer: BC Managed Care – PPO

## 2021-06-06 ENCOUNTER — Ambulatory Visit (HOSPITAL_COMMUNITY): Payer: BC Managed Care – PPO | Admitting: Hematology

## 2021-07-17 IMAGING — CT CT CHEST W/ CM
2 of 3 series · 15 of 36 positions shown, 18 images · IV contrast (Omnipaque or Isovue)
Comparison: CT abdomen pelvis, 04/14/2020

CLINICAL DATA: Follow-up right lung nodule seen on CT of the
abdomen and pelvis

EXAM:
CT CHEST WITH CONTRAST
TECHNIQUE: Multidetector CT imaging of the chest was performed during
intravenous contrast administration.
CONTRAST:  75mL OMNIPAQUE IOHEXOL 300 MG/ML  SOLN

[Series 2: routine chest with · axial · 0.65mm/px · z∈[-14,+274]mm · 12 of 170 slices shown, 15 images]
[im 13/170  mediastinal]
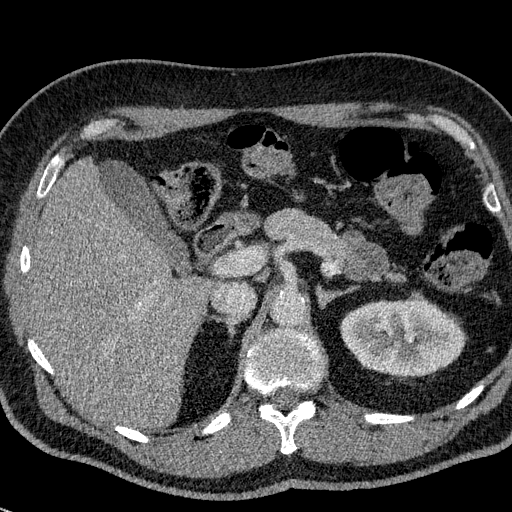
[im 13/170  lung]
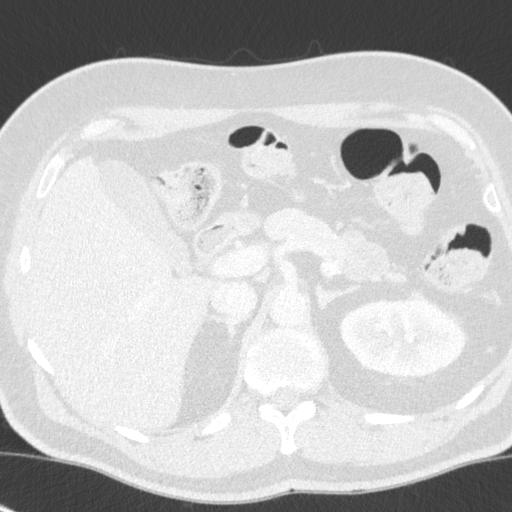
[im 26/170  lung]
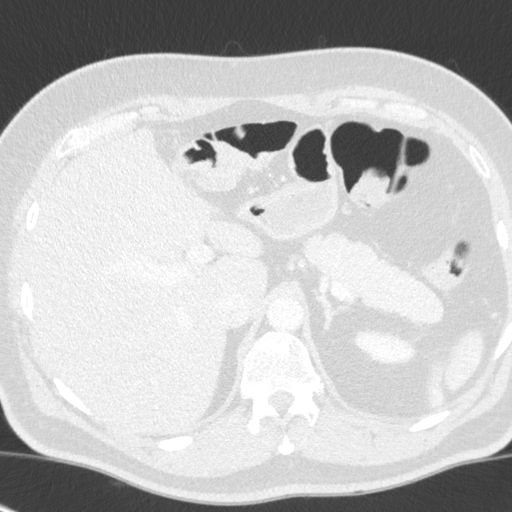
[im 38/170  lung]
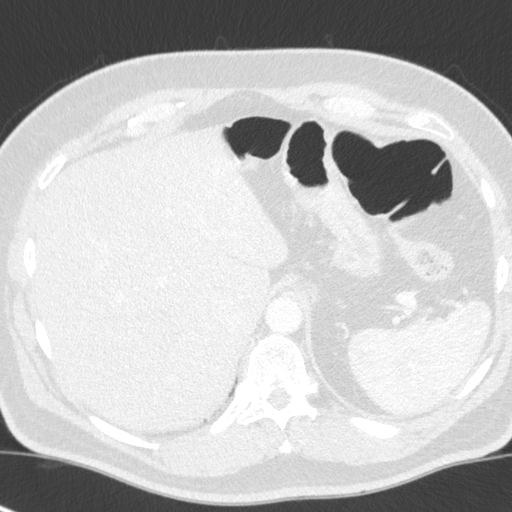
[im 51/170  lung]
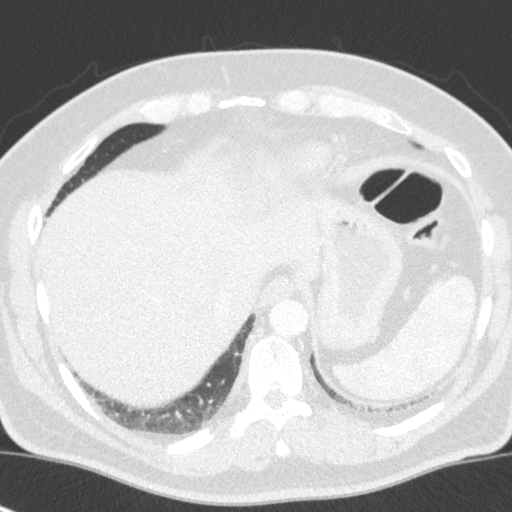
[im 63/170  mediastinal]
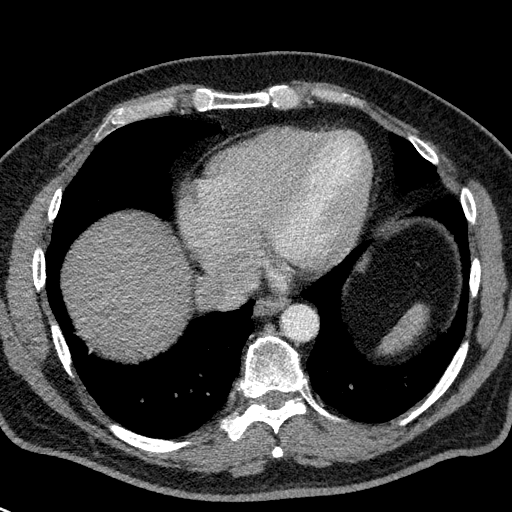
[im 63/170  lung]
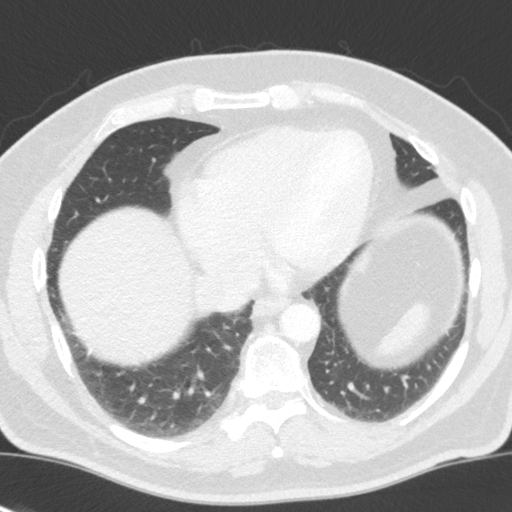
[im 76/170  lung]
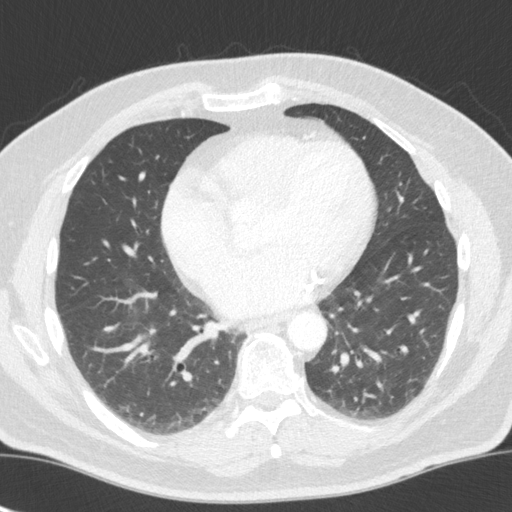
[im 94/170  lung]
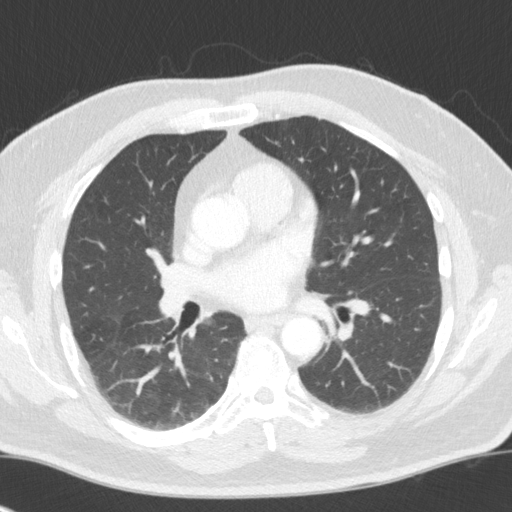
[im 107/170  lung]
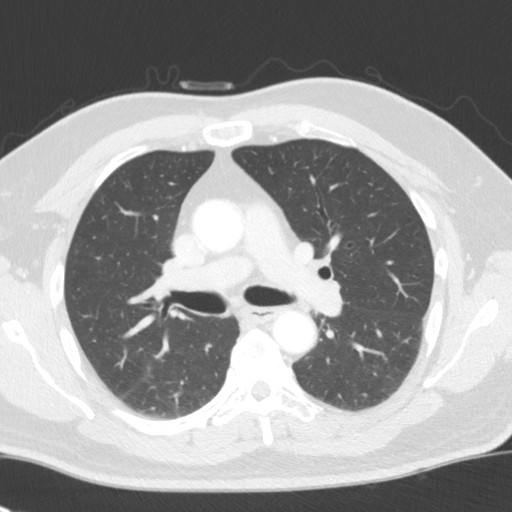
[im 119/170  mediastinal]
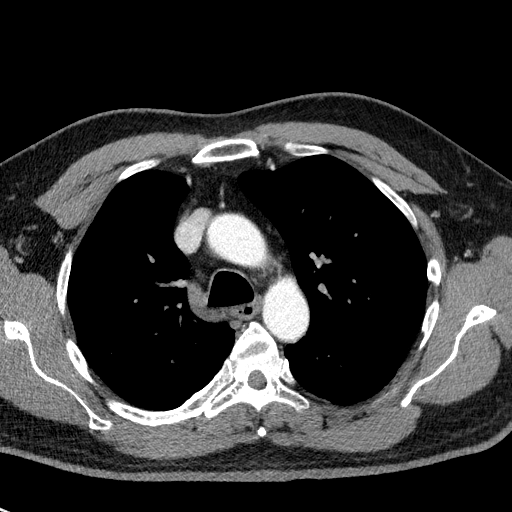
[im 119/170  lung]
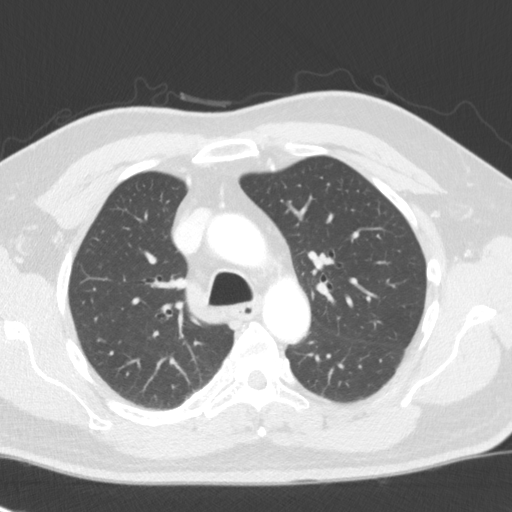
[im 132/170  lung]
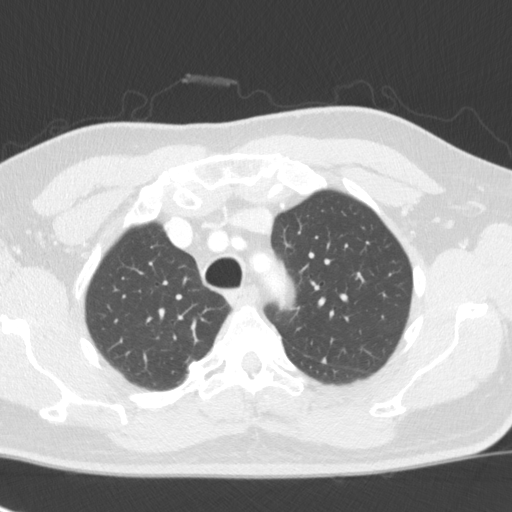
[im 144/170  lung]
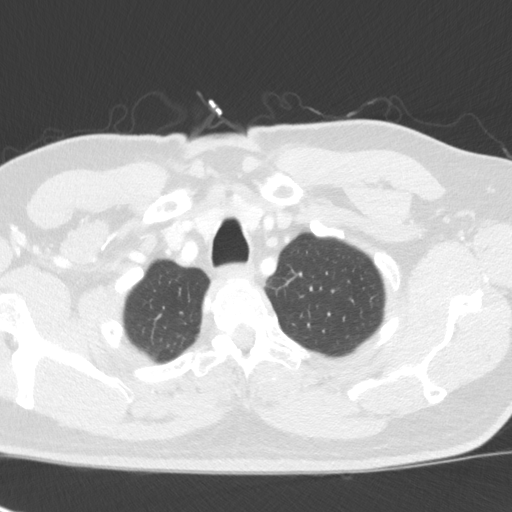
[im 157/170  lung]
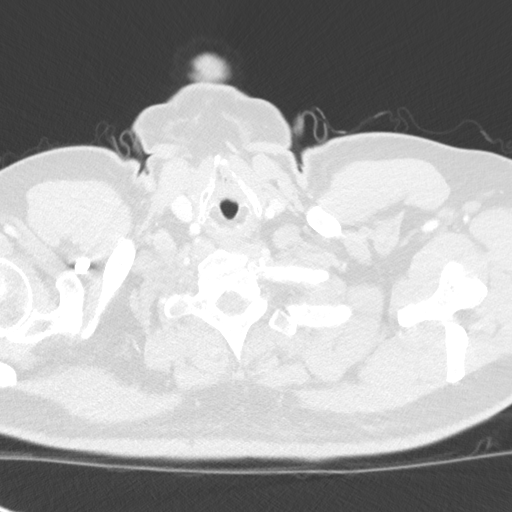

[Series 5: coronal · coronal · 0.73mm/px · 3 of 134 slices shown]
[im 27/134  lung]
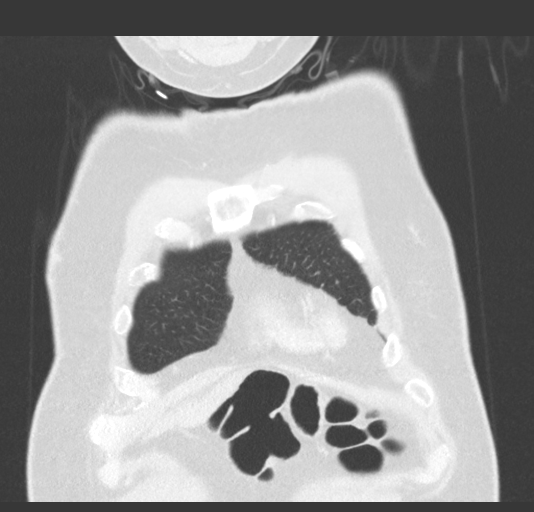
[im 54/134  lung]
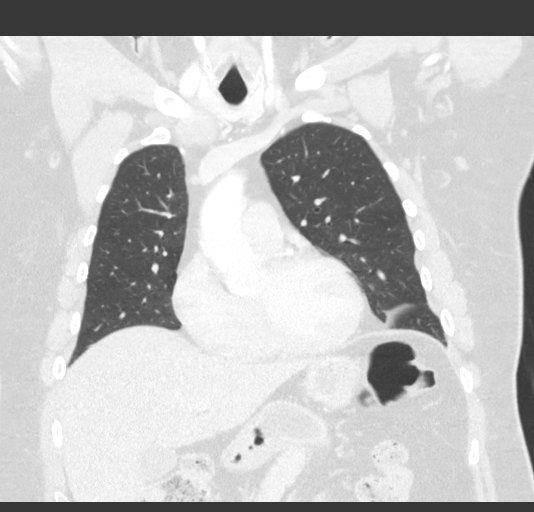
[im 80/134  lung]
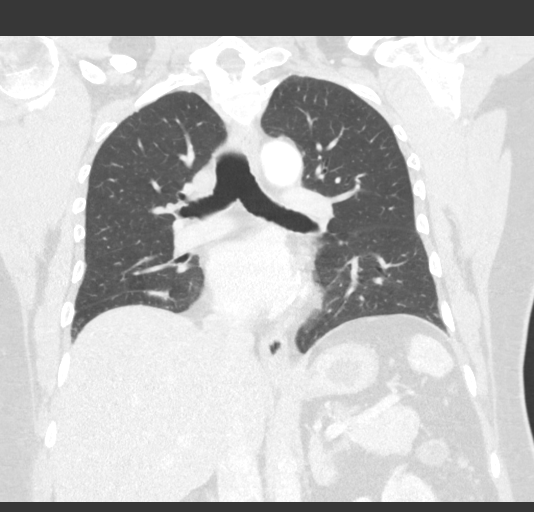

[15 of 36 positions shown; findings below may reference images not displayed]

FINDINGS: Cardiovascular: No significant vascular findings. Normal heart size.
Three-vessel coronary artery calcifications and/or stents. No
pericardial effusion.

Mediastinum/Nodes: No enlarged mediastinal, hilar, or axillary lymph
nodes. Thyroid gland, trachea, and esophagus demonstrate no
significant findings.

Lungs/Pleura: Unchanged 7 mm pulmonary nodule of the inferior right
upper lobe, which on CT examination of the chest is more clearly
demonstrated as a definitively benign fissural nodule (series 4,
image 85). Definitively benign 4 mm fissural nodule of the superior
segment right lower lobe (series 4, image 85). Definitively benign 3
mm fissural nodule of the posterior left upper lobe (series 4, image
38). No pleural effusion or pneumothorax.

Upper Abdomen: No acute abnormality. Hepatic steatosis.
Subcentimeter left adrenal nodule, better evaluated by prior CT of
the abdomen pelvis (series 2, image 154).

Musculoskeletal: No chest wall mass or suspicious bone lesions
identified.
IMPRESSION: 1. Unchanged 7 mm pulmonary nodule of the inferior right upper lobe,
which on CT examination of the chest is more clearly demonstrated as
a definitively benign fissural nodule. Definitively benign small
fissural nodules of the superior segment right lower lobe and
posterior left upper lobe.
2. Three-vessel coronary artery calcifications and/or stents.
3. Hepatic steatosis.
4. Subcentimeter left adrenal nodule, better evaluated by prior CT
of the abdomen pelvis although likely a benign adenoma.

## 2021-07-31 ENCOUNTER — Other Ambulatory Visit: Payer: Self-pay | Admitting: "Endocrinology

## 2022-01-07 ENCOUNTER — Other Ambulatory Visit (HOSPITAL_COMMUNITY): Payer: Self-pay

## 2022-01-07 DIAGNOSIS — R911 Solitary pulmonary nodule: Secondary | ICD-10-CM

## 2022-01-07 NOTE — Progress Notes (Signed)
Order placed for CT chest without contrast per Dr. Ellin Saba. Schedule and follow-up pending. ?

## 2022-01-28 ENCOUNTER — Other Ambulatory Visit (HOSPITAL_COMMUNITY): Payer: Self-pay

## 2022-01-28 DIAGNOSIS — E278 Other specified disorders of adrenal gland: Secondary | ICD-10-CM

## 2022-01-29 ENCOUNTER — Inpatient Hospital Stay (HOSPITAL_COMMUNITY): Payer: BC Managed Care – PPO | Attending: Hematology

## 2022-01-29 DIAGNOSIS — E119 Type 2 diabetes mellitus without complications: Secondary | ICD-10-CM | POA: Insufficient documentation

## 2022-01-29 DIAGNOSIS — Z8042 Family history of malignant neoplasm of prostate: Secondary | ICD-10-CM | POA: Diagnosis not present

## 2022-01-29 DIAGNOSIS — E278 Other specified disorders of adrenal gland: Secondary | ICD-10-CM | POA: Diagnosis not present

## 2022-01-29 DIAGNOSIS — R911 Solitary pulmonary nodule: Secondary | ICD-10-CM | POA: Insufficient documentation

## 2022-01-29 DIAGNOSIS — I2510 Atherosclerotic heart disease of native coronary artery without angina pectoris: Secondary | ICD-10-CM | POA: Insufficient documentation

## 2022-01-29 DIAGNOSIS — D649 Anemia, unspecified: Secondary | ICD-10-CM | POA: Insufficient documentation

## 2022-01-29 LAB — COMPREHENSIVE METABOLIC PANEL
ALT: 23 U/L (ref 0–44)
AST: 22 U/L (ref 15–41)
Albumin: 3.9 g/dL (ref 3.5–5.0)
Alkaline Phosphatase: 89 U/L (ref 38–126)
Anion gap: 7 (ref 5–15)
BUN: 11 mg/dL (ref 8–23)
CO2: 23 mmol/L (ref 22–32)
Calcium: 8.6 mg/dL — ABNORMAL LOW (ref 8.9–10.3)
Chloride: 105 mmol/L (ref 98–111)
Creatinine, Ser: 0.79 mg/dL (ref 0.61–1.24)
GFR, Estimated: >60 mL/min (ref >60)
Glucose, Bld: 233 mg/dL — ABNORMAL HIGH (ref 70–99)
Potassium: 3.8 mmol/L (ref 3.5–5.1)
Sodium: 135 mmol/L (ref 135–145)
Total Bilirubin: 0.4 mg/dL (ref 0.3–1.2)
Total Protein: 7.5 g/dL (ref 6.5–8.1)

## 2022-01-29 LAB — CBC WITH DIFFERENTIAL/PLATELET
Abs Immature Granulocytes: 0.06 10*3/uL (ref 0.00–0.07)
Basophils Absolute: 0 10*3/uL (ref 0.0–0.1)
Basophils Relative: 0 %
Eosinophils Absolute: 0.3 10*3/uL (ref 0.0–0.5)
Eosinophils Relative: 3 %
HCT: 40.7 % (ref 39.0–52.0)
Hemoglobin: 13.5 g/dL (ref 13.0–17.0)
Immature Granulocytes: 1 %
Lymphocytes Relative: 22 %
Lymphs Abs: 2.4 10*3/uL (ref 0.7–4.0)
MCH: 29.9 pg (ref 26.0–34.0)
MCHC: 33.2 g/dL (ref 30.0–36.0)
MCV: 90.2 fL (ref 80.0–100.0)
Monocytes Absolute: 0.8 10*3/uL (ref 0.1–1.0)
Monocytes Relative: 8 %
Neutro Abs: 7.1 10*3/uL (ref 1.7–7.7)
Neutrophils Relative %: 66 %
Platelets: 251 10*3/uL (ref 150–400)
RBC: 4.51 MIL/uL (ref 4.22–5.81)
RDW: 11.7 % (ref 11.5–15.5)
WBC: 10.7 10*3/uL — ABNORMAL HIGH (ref 4.0–10.5)
nRBC: 0 % (ref 0.0–0.2)

## 2022-01-30 ENCOUNTER — Ambulatory Visit (HOSPITAL_COMMUNITY)
Admission: RE | Admit: 2022-01-30 | Discharge: 2022-01-30 | Disposition: A | Discharge status: Home / Self Care | Payer: BC Managed Care – PPO | Source: Ambulatory Visit | Attending: Hematology | Admitting: Hematology

## 2022-01-30 DIAGNOSIS — R911 Solitary pulmonary nodule: Secondary | ICD-10-CM | POA: Insufficient documentation

## 2022-02-06 ENCOUNTER — Inpatient Hospital Stay (HOSPITAL_BASED_OUTPATIENT_CLINIC_OR_DEPARTMENT_OTHER): Payer: BC Managed Care – PPO | Admitting: Hematology

## 2022-02-06 VITALS — BP 112/70 | HR 96 | Temp 99.3°F | Resp 18 | Ht 73.0 in | Wt 243.4 lb

## 2022-02-06 DIAGNOSIS — R911 Solitary pulmonary nodule: Secondary | ICD-10-CM

## 2022-02-06 DIAGNOSIS — E278 Other specified disorders of adrenal gland: Secondary | ICD-10-CM | POA: Diagnosis not present

## 2022-02-06 NOTE — Patient Instructions (Signed)
[facility] at [facility] ?Discharge Instructions ? ?You were seen and examined today by Dr. Ellin Saba. He reviewed your most recent labs and scan and everything looks good. Please follow up as needed. ? ? ?Thank you for choosing [facility] at [facility] to provide your oncology and hematology care.  To afford each patient quality time with our provider, please arrive at least 15 minutes before your scheduled appointment time.  ? ?If you have a lab appointment with the Cancer Center please come in thru the Main Entrance and check in at the main information desk. ? ?You need to re-schedule your appointment should you arrive 10 or more minutes late.  We strive to give you quality time with our providers, and arriving late affects you and other patients whose appointments are after yours.  Also, if you no show three or more times for appointments you may be dismissed from the clinic at the providers discretion.     ?Again, thank you for choosing [facility].  Our hope is that these requests will decrease the amount of time that you wait before being seen by our physicians.       ?_____________________________________________________________ ? ?Should you have questions after your visit to [facility], please contact our office at [phone] and follow the prompts.  Our office hours are 8:00 a.m. and 4:30 p.m. Monday - Friday.  Please note that voicemails left after 4:00 p.m. may not be returned until the following business day.  We are closed weekends and major holidays.  You do have access to a nurse 24-7, just call the main number to the clinic [phone] and do not press any options, hold on the line and a nurse will answer the phone.   ? ?For prescription refill requests, have your pharmacy contact our office and allow 72 hours.   ? ?Due to Covid, you will need to wear a mask upon entering the hospital. If you do not have a mask, a mask  will be given to you at the Main Entrance upon arrival. For doctor visits, patients may have 1 support person age 83 or older with them. For treatment visits, patients can not have anyone with them due to social distancing guidelines and our immunocompromised population.  ? ?  ?

## 2022-02-06 NOTE — Progress Notes (Signed)
? ?[name] ?[address] ?[place], [place] [address] ? ? ?CLINIC:  ?Medical Oncology/Hematology ? ?PCP:  ?System, Provider Not In ?None ?None ? ? ?REASON FOR VISIT:  ?Follow-up for left adrenal nodule and right lung nodule ? ?PRIOR THERAPY: none ? ?NGS Results: not done ? ?CURRENT THERAPY: surveillance ? ?INTERVAL HISTORY:  ?George White, a 61 y.o. male, returns for routine follow-up of his left adrenal nodule and right lung nodule. George White was last seen on 12/04/2020.  ? ?Today he reports feeling good. He reports he previously had lymph node swellings in his neck accompanied by hoarseness which has since resolved.  ? ?REVIEW OF SYSTEMS:  ?Review of Systems  ?Constitutional:  Negative for appetite change and fatigue.  ?Neurological:  Positive for numbness.  ?All other systems reviewed and are negative. ? ?PAST MEDICAL/SURGICAL HISTORY:  ?Past Medical History:  ?Diagnosis Date  ? Diabetes mellitus, type II (HCC)   ? Heart attack (HCC) 02/23/2020  ? Multiple stents placed  ? Hyperlipidemia   ? ?Past Surgical History:  ?Procedure Laterality Date  ? KNEE SURGERY    ? ROTATOR CUFF REPAIR    ? ? ?SOCIAL HISTORY:  ?Social History  ? ?Socioeconomic History  ? Marital status: Married  ?  Spouse name: Not on file  ? Number of children: 1  ? Years of education: Not on file  ? Highest education level: Not on file  ?Occupational History  ? Occupation: EMPLOYED  ?Tobacco Use  ? Smoking status: Never  ? Smokeless tobacco: Never  ?Vaping Use  ? Vaping Use: Never used  ?Substance and Sexual Activity  ? Alcohol use: Yes  ?  Alcohol/week: 0.0 standard drinks  ?  Comment: occ  ? Drug use: No  ? Sexual activity: Not on file  ?Other Topics Concern  ? Not on file  ?Social History Narrative  ? Not on file  ? ?Social Determinants of Health  ? ?Financial Resource Strain: Not on file  ?Food Insecurity: Not on file  ?Transportation Needs: Not on file  ?Physical Activity: Not on file  ?Stress: Not on file  ?Social  Connections: Not on file  ?Intimate Partner Violence: Not on file  ? ? ?FAMILY HISTORY:  ?Family History  ?Problem Relation Age of Onset  ? Ulcers Father   ? Prostate cancer Father   ? Dementia Maternal Grandmother   ? Heart attack Paternal Grandmother   ? Stroke Paternal Grandfather   ? Colon cancer Neg Hx   ? Gastric cancer Neg Hx   ? Esophageal cancer Neg Hx   ? ? ?CURRENT MEDICATIONS:  ?Current Outpatient Medications  ?Medication Sig Dispense Refill  ? aspirin 81 MG EC tablet Take 1 tablet by mouth daily.    ? carvedilol (COREG) 3.125 MG tablet Take 3.125 mg by mouth 2 (two) times daily with a meal.    ? cetirizine (ZYRTEC) 10 MG chewable tablet Chew 10 mg by mouth as needed for allergies.    ? Continuous Blood Gluc Sensor (FREESTYLE LIBRE 14 DAY SENSOR) MISC INJECT 1 INTO THE SKIN ONCE EVERY 14 DAYS AS DIRECTED 2 each 2  ? dicyclomine (BENTYL) 20 MG tablet Take 1 tablet (20 mg total) by mouth 3 (three) times daily as needed for spasms (or diarrhea). 30 tablet 2  ? Insulin Pen Needle (ULTICARE MICRO PEN NEEDLES) 32G X 4 MM MISC 1 Device by Other route in the morning, at noon, in the evening, and at bedtime. qid 200 each 3  ?  insulin regular human CONCENTRATED (HUMULIN R U-500 KWIKPEN) 500 UNIT/ML kwikpen Inject 70 Units into the skin 3 (three) times daily with meals. 18 mL 2  ? levothyroxine (SYNTHROID) 25 MCG tablet Take 25 mcg by mouth daily.    ? lisinopril (ZESTRIL) 5 MG tablet Take 5 mg by mouth daily.    ? metFORMIN (GLUCOPHAGE-XR) 500 MG 24 hr tablet TAKE 1 TABLET BY MOUTH EVERY DAY WITH BREAKFAST 90 tablet 1  ? prasugrel (EFFIENT) 10 MG TABS tablet Take 10 mg by mouth daily.    ? ranolazine (RANEXA) 500 MG 12 hr tablet Take 500 mg by mouth 2 (two) times daily.    ? Risankizumab-rzaa (SKYRIZI PEN [place]) Inject into the skin every 3 (three) months.    ? rosuvastatin (CRESTOR) 10 MG tablet Take 10 mg by mouth at bedtime.    ? Semaglutide, 1 MG/DOSE, (OZEMPIC, 1 MG/DOSE,) 4 MG/3ML SOPN Inject 1 mg into the skin  once a week. 9 mL 1  ? sildenafil (VIAGRA) 50 MG tablet TAKE 1 TABLET BY MOUTH AS DIRECTED 6 tablet 1  ? ?No current facility-administered medications for this visit.  ? ? ?ALLERGIES:  ?No Known Allergies ? ?PHYSICAL EXAM:  ?Performance status (ECOG): 0 - Asymptomatic ? ?Vitals:  ? 02/06/22 1400  ?BP: 112/70  ?Pulse: 96  ?Resp: 18  ?Temp: 99.3 ?F (37.4 ?C)  ?SpO2: 96%  ? ?Wt Readings from Last 3 Encounters:  ?02/06/22 243 lb 7 oz (110.4 kg)  ?12/04/20 248 lb 4 oz (112.6 kg)  ?11/24/20 252 lb 3.2 oz (114.4 kg)  ? ?Physical Exam ?Vitals reviewed.  ?Constitutional:   ?   Appearance: Normal appearance. He is obese.  ?Cardiovascular:  ?   Rate and Rhythm: Normal rate and regular rhythm.  ?   Pulses: Normal pulses.  ?   Heart sounds: Normal heart sounds.  ?Pulmonary:  ?   Effort: Pulmonary effort is normal.  ?   Breath sounds: Normal breath sounds.  ?Lymphadenopathy:  ?   Cervical: No cervical adenopathy.  ?   Right cervical: No superficial, deep or posterior cervical adenopathy. ?   Left cervical: No superficial, deep or posterior cervical adenopathy.  ?   Upper Body:  ?   Right upper body: No supraclavicular, axillary or pectoral adenopathy.  ?   Left upper body: No supraclavicular, axillary or pectoral adenopathy.  ?Neurological:  ?   General: No focal deficit present.  ?   Mental Status: He is alert and oriented to person, place, and time.  ?Psychiatric:     ?   Mood and Affect: Mood normal.     ?   Behavior: Behavior normal.  ?  ? ?LABORATORY DATA:  ?I have reviewed the labs as listed.  ? ?  Latest Ref Rng & Units 01/29/2022  ?  2:59 PM 11/30/2020  ?  8:49 AM 05/25/2020  ? 12:38 PM  ?CBC  ?WBC 4.0 - 10.5 K/uL 10.7   8.9   9.1    ?Hemoglobin 13.0 - 17.0 g/dL 46.8   03.2   12.2    ?Hematocrit 39.0 - 52.0 % 40.7   43.2   38.6    ?Platelets 150 - 400 K/uL 251   252   221    ? ? ?  Latest Ref Rng & Units 01/29/2022  ?  2:59 PM 07/05/2020  ? 12:00 AM 05/25/2020  ? 12:38 PM  ?CMP  ?Glucose 70 - 99 mg/dL 482    500    ?  BUN 8 - 23  mg/dL 11   11      8     ?Creatinine 0.61 - 1.24 mg/dL 3.81   0.7      8.29    ?Sodium 135 - 145 mmol/L 135    136    ?Potassium 3.5 - 5.1 mmol/L 3.8   4.3      3.6    ?Chloride 98 - 111 mmol/L 105    103    ?CO2 22 - 32 mmol/L 23    26    ?Calcium 8.9 - 10.3 mg/dL 8.6   8.6      8.6    ?Total Protein 6.5 - 8.1 g/dL 7.5    7.2    ?Total Bilirubin 0.3 - 1.2 mg/dL 0.4    0.3    ?Alkaline Phos 38 - 126 U/L 89    83    ?AST 15 - 41 U/L 22    23    ?ALT 0 - 44 U/L 23    35    ?  ? This result is from an external source.  ? ? ?DIAGNOSTIC IMAGING:  ?I have independently reviewed the scans and discussed with the patient. ?CT Chest Wo Contrast ? ?Result Date: 01/31/2022 ?CLINICAL DATA:  Follow-up of right-sided pulmonary nodule. EXAM: CT CHEST WITHOUT CONTRAST TECHNIQUE: Multidetector CT imaging of the chest was performed following the standard protocol without IV contrast. RADIATION DOSE REDUCTION: This exam was performed according to the departmental dose-optimization program which includes automated exposure control, adjustment of the mA and/or kV according to patient size and/or use of iterative reconstruction technique. COMPARISON:  05/25/2020 FINDINGS: Cardiovascular: Aortic atherosclerosis. Mild cardiomegaly, without pericardial effusion. Lad and left circumflex coronary artery calcification. Possible left circumflex stent. Mediastinum/Nodes: No mediastinal or definite hilar adenopathy, given limitations of unenhanced CT. Lungs/Pleura: No pleural fluid. Subpleural lymph nodes along the right minor fissure at 5 mm and right major fissure at 4 mm again identified. No enlarging or suspicious pulmonary nodules identified. Upper Abdomen: Normal imaged portions of the liver, spleen, stomach, pancreas, gallbladder, right adrenal gland, left kidney. Mild left adrenal thickening is unchanged. Musculoskeletal: No acute osseous abnormality. IMPRESSION: 1. Redemonstration of 2 benign right-sided subpleural lymph nodes. 2.  No acute  process in the chest. 3. Coronary artery atherosclerosis. Aortic Atherosclerosis (ICD10-I70.0). Electronically Signed   By: Jeronimo Greaves M.D.   On: 01/31/2022 09:38    ? ?ASSESSMENT:  ?1.  Right lung nodule: ?
